# Patient Record
Sex: Female | Born: 1940
Health system: Southern US, Community
[De-identification: ages and names within clinical notes are randomized; demographics above are authoritative.]

## PROBLEM LIST (undated history)

## (undated) DIAGNOSIS — K219 Gastro-esophageal reflux disease without esophagitis: Secondary | ICD-10-CM

## (undated) DIAGNOSIS — K52839 Microscopic colitis, unspecified: Secondary | ICD-10-CM

## (undated) DIAGNOSIS — I10 Essential (primary) hypertension: Secondary | ICD-10-CM

## (undated) DIAGNOSIS — K589 Irritable bowel syndrome without diarrhea: Secondary | ICD-10-CM

## (undated) DIAGNOSIS — T781XXA Other adverse food reactions, not elsewhere classified, initial encounter: Secondary | ICD-10-CM

## (undated) HISTORY — PX: APPENDECTOMY: SHX54

## (undated) HISTORY — DX: Gastro-esophageal reflux disease without esophagitis: K21.9

## (undated) HISTORY — PX: OVARIAN CYST REMOVAL: SHX89

## (undated) HISTORY — DX: Microscopic colitis, unspecified: K52.839

## (undated) HISTORY — DX: Irritable bowel syndrome without diarrhea: K58.9

---

## 1999-04-02 ENCOUNTER — Other Ambulatory Visit: Admission: RE | Admit: 1999-04-02 | Discharge: 1999-04-02 | Payer: Self-pay | Admitting: Family Medicine

## 1999-05-15 ENCOUNTER — Encounter: Admission: RE | Admit: 1999-05-15 | Discharge: 1999-05-15 | Payer: Self-pay | Admitting: Family Medicine

## 1999-05-15 ENCOUNTER — Encounter: Payer: Self-pay | Admitting: Family Medicine

## 2000-05-19 ENCOUNTER — Encounter: Admission: RE | Admit: 2000-05-19 | Discharge: 2000-05-19 | Payer: Self-pay | Admitting: Family Medicine

## 2000-05-19 ENCOUNTER — Encounter: Payer: Self-pay | Admitting: Family Medicine

## 2000-05-21 ENCOUNTER — Other Ambulatory Visit: Admission: RE | Admit: 2000-05-21 | Discharge: 2000-05-21 | Payer: Self-pay | Admitting: Family Medicine

## 2014-03-09 ENCOUNTER — Other Ambulatory Visit: Payer: Self-pay | Admitting: Allergy and Immunology

## 2014-03-09 ENCOUNTER — Ambulatory Visit
Admission: RE | Admit: 2014-03-09 | Discharge: 2014-03-09 | Disposition: A | Discharge status: Home / Self Care | Payer: Medicare Other | Source: Ambulatory Visit | Attending: Allergy and Immunology | Admitting: Allergy and Immunology

## 2014-03-09 DIAGNOSIS — R05 Cough: Secondary | ICD-10-CM

## 2014-03-09 DIAGNOSIS — R059 Cough, unspecified: Secondary | ICD-10-CM

## 2014-03-10 ENCOUNTER — Other Ambulatory Visit: Payer: Self-pay | Admitting: Allergy and Immunology

## 2014-03-10 DIAGNOSIS — R911 Solitary pulmonary nodule: Secondary | ICD-10-CM

## 2014-03-13 ENCOUNTER — Ambulatory Visit
Admission: RE | Admit: 2014-03-13 | Discharge: 2014-03-13 | Disposition: A | Discharge status: Home / Self Care | Payer: Medicare Other | Source: Ambulatory Visit | Attending: Allergy and Immunology | Admitting: Allergy and Immunology

## 2014-03-13 DIAGNOSIS — R911 Solitary pulmonary nodule: Secondary | ICD-10-CM

## 2016-01-03 IMAGING — CR DG CHEST 2V
2 series · 2 of 2 positions shown · non-contrast
Comparison: None.

CLINICAL DATA: Productive cough for 2 weeks ; chest soreness
secondary to and coughing

EXAM:
CHEST  2 VIEW

[view not recorded (1 of 2)]
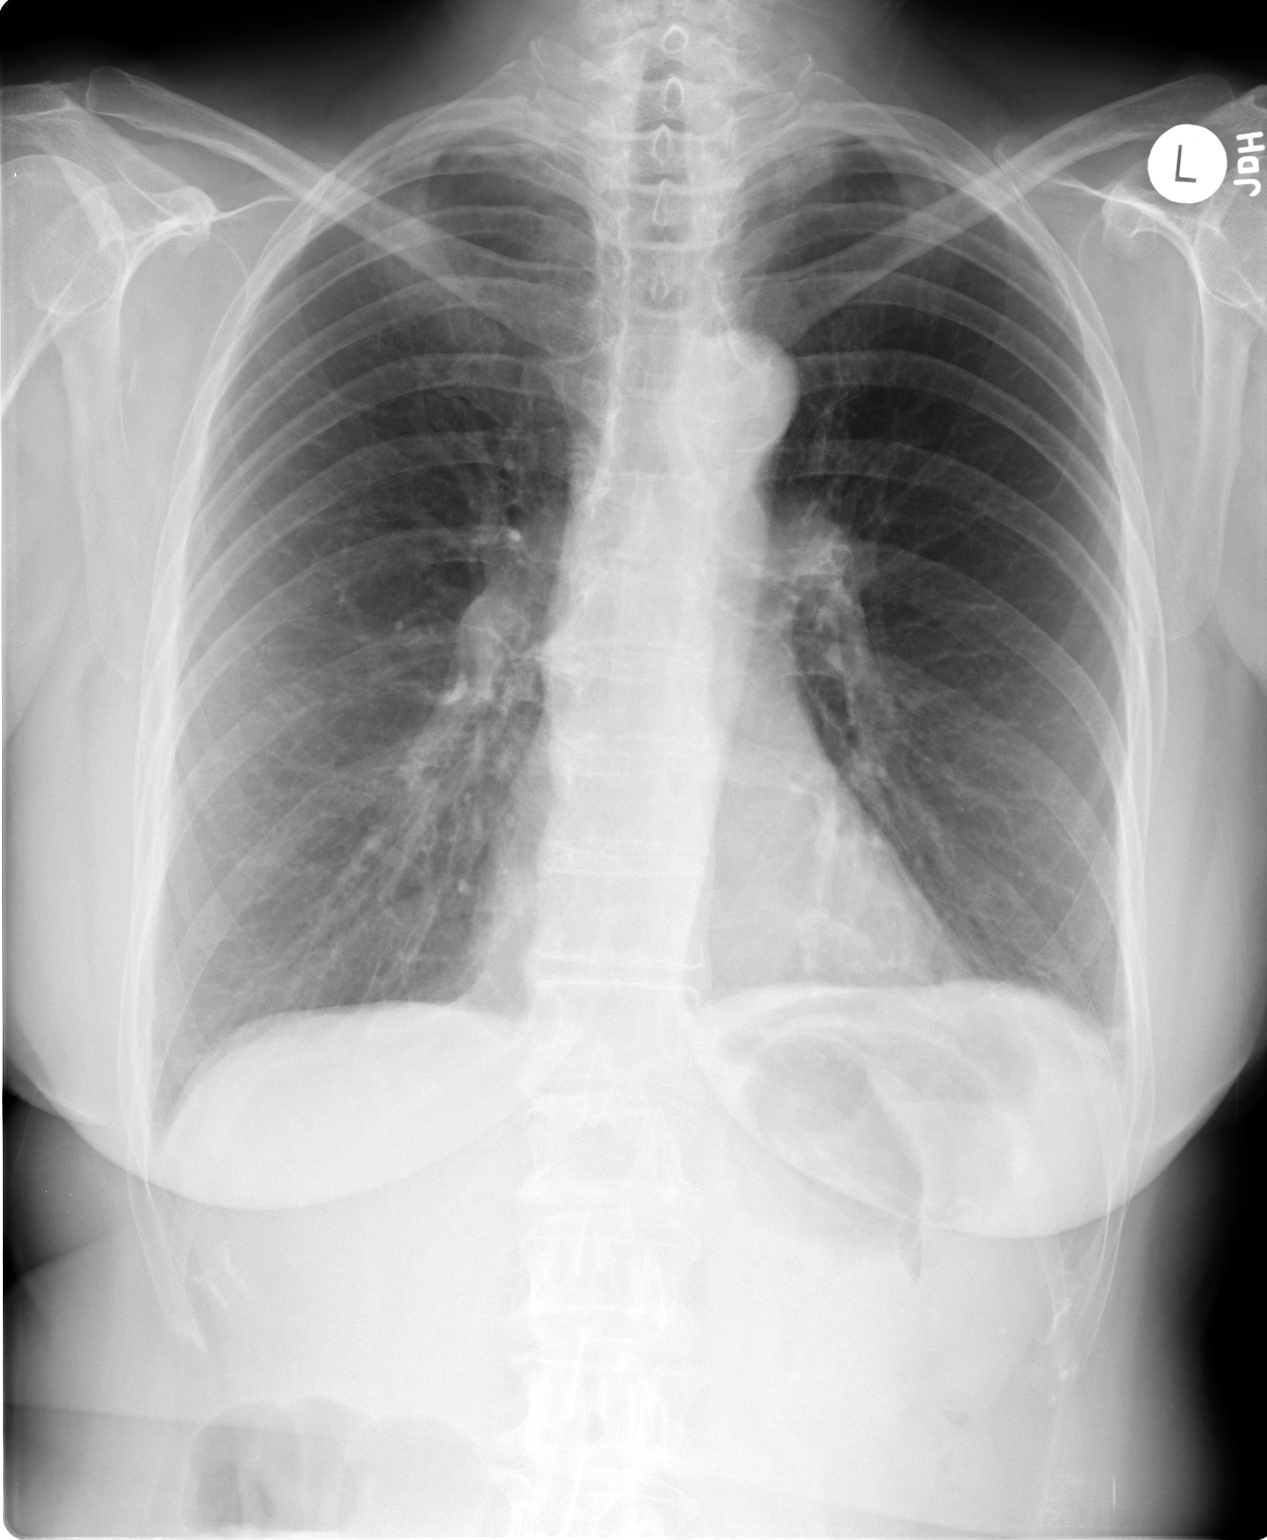

[view not recorded (2 of 2)]
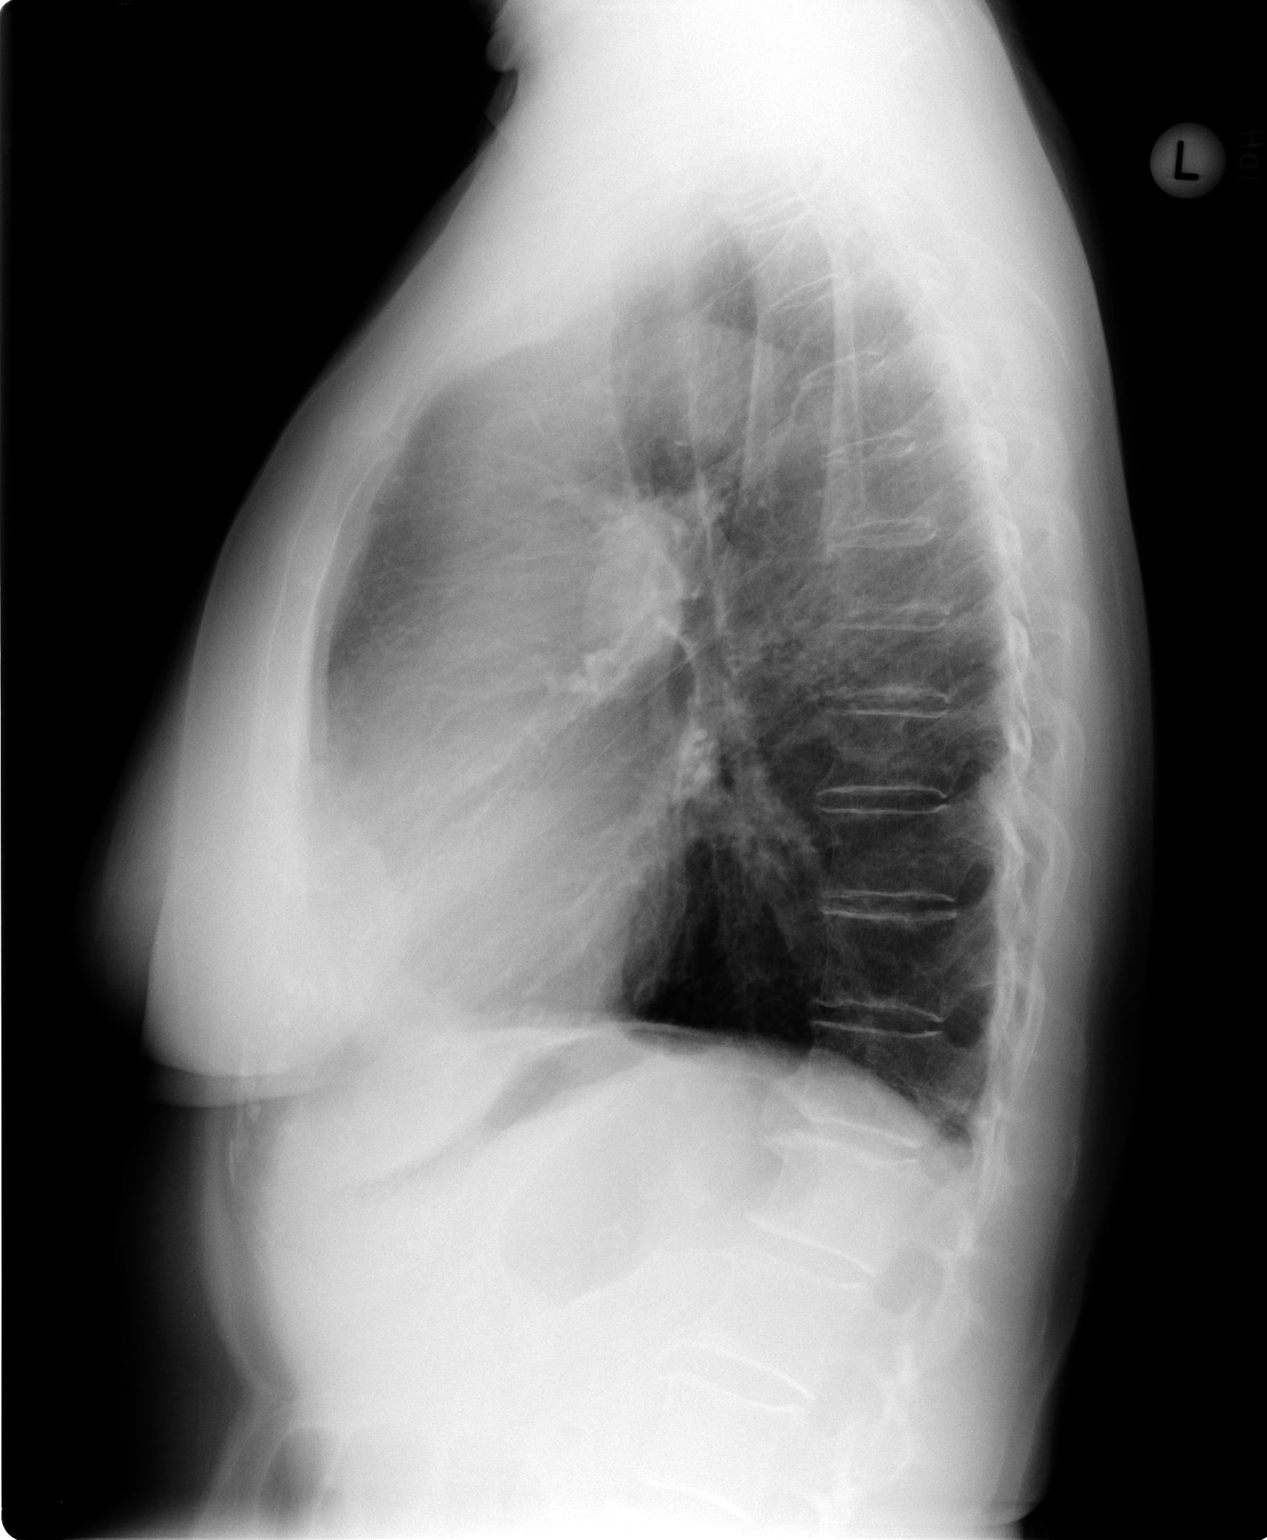

[2 of 2 positions shown; findings below may reference images not displayed]

FINDINGS: The lungs are mildly hyperinflated with hemidiaphragm flattening.
There is no focal infiltrate. However, there is subtle increased
density in the right pulmonary apex. This is end a region where ribs
and the medial aspect of the right clavicle overlap. The heart and
pulmonary vascularity are normal. There is minimal density
projecting over the left lateral costophrenic angle. The bony thorax
exhibits no acute abnormality.
IMPRESSION: Hyperinflation consistent with COPD. There is subtle increased
density in the right upper lobe which is nonspecific but could
reflect pneumonia or an abnormal nodule or mass. At minimum at
apical lordotic film is needed. Alternatively chest CT scanning
would allow better evaluation of the pulmonary parenchyma.

## 2019-06-19 ENCOUNTER — Ambulatory Visit: Payer: Medicare Other | Attending: Internal Medicine

## 2019-06-19 DIAGNOSIS — Z23 Encounter for immunization: Secondary | ICD-10-CM

## 2019-06-19 NOTE — Progress Notes (Signed)
   Covid-19 Vaccination Clinic  Name:  Lauren Rice    MRN: [id number] DOB: Oct 21, 1940  06/19/2019  Lauren Rice was observed post Covid-19 immunization for 15 minutes without incidence. She was provided with Vaccine Information Sheet and instruction to access the V-Safe system.   Lauren Rice was instructed to call 911 with any severe reactions post vaccine: Marland Kitchen Difficulty breathing  . Swelling of your face and throat  . A fast heartbeat  . A bad rash all over your body  . Dizziness and weakness    Immunizations Administered    Name Date Dose VIS Date Route   Pfizer COVID-19 Vaccine 06/19/2019 10:43 AM 0.3 mL 04/01/2019 Intramuscular   Manufacturer: [employer], [employer]   Lot: [id number]   NDC: [address]-3

## 2019-06-27 ENCOUNTER — Encounter (HOSPITAL_COMMUNITY): Payer: Self-pay | Admitting: Emergency Medicine

## 2019-06-27 ENCOUNTER — Other Ambulatory Visit: Payer: Self-pay

## 2019-06-27 ENCOUNTER — Emergency Department (HOSPITAL_COMMUNITY)
Admission: EM | Admit: 2019-06-27 | Discharge: 2019-06-27 | Disposition: A | Discharge status: Home / Self Care | Payer: Medicare Other | Attending: Emergency Medicine | Admitting: Emergency Medicine

## 2019-06-27 ENCOUNTER — Emergency Department (HOSPITAL_COMMUNITY): Payer: Medicare Other

## 2019-06-27 DIAGNOSIS — R42 Dizziness and giddiness: Secondary | ICD-10-CM | POA: Insufficient documentation

## 2019-06-27 DIAGNOSIS — Z20822 Contact with and (suspected) exposure to covid-19: Secondary | ICD-10-CM | POA: Diagnosis not present

## 2019-06-27 DIAGNOSIS — J0110 Acute frontal sinusitis, unspecified: Secondary | ICD-10-CM

## 2019-06-27 DIAGNOSIS — I10 Essential (primary) hypertension: Secondary | ICD-10-CM | POA: Diagnosis not present

## 2019-06-27 HISTORY — DX: Essential (primary) hypertension: I10

## 2019-06-27 LAB — COMPREHENSIVE METABOLIC PANEL
ALT: 15 U/L (ref 0–44)
AST: 22 U/L (ref 15–41)
Albumin: 4.3 g/dL (ref 3.5–5.0)
Alkaline Phosphatase: 53 U/L (ref 38–126)
Anion gap: 11 (ref 5–15)
BUN: 19 mg/dL (ref 8–23)
CO2: 25 mmol/L (ref 22–32)
Calcium: 9.2 mg/dL (ref 8.9–10.3)
Chloride: 101 mmol/L (ref 98–111)
Creatinine, Ser: 0.84 mg/dL (ref 0.44–1.00)
GFR calc Af Amer: >60 mL/min (ref >60)
GFR calc non Af Amer: >60 mL/min (ref >60)
Glucose, Bld: 117 mg/dL — ABNORMAL HIGH (ref 70–99)
Potassium: 3.5 mmol/L (ref 3.5–5.1)
Sodium: 137 mmol/L (ref 135–145)
Total Bilirubin: 1.1 mg/dL (ref 0.3–1.2)
Total Protein: 7.9 g/dL (ref 6.5–8.1)

## 2019-06-27 LAB — CBC
HCT: 38.4 % (ref 36.0–46.0)
Hemoglobin: 12.9 g/dL (ref 12.0–15.0)
MCH: 33.9 pg (ref 26.0–34.0)
MCHC: 33.6 g/dL (ref 30.0–36.0)
MCV: 100.8 fL — ABNORMAL HIGH (ref 80.0–100.0)
Platelets: 173 10*3/uL (ref 150–400)
RBC: 3.81 MIL/uL — ABNORMAL LOW (ref 3.87–5.11)
RDW: 13.3 % (ref 11.5–15.5)
WBC: 19.2 10*3/uL — ABNORMAL HIGH (ref 4.0–10.5)
nRBC: 0 % (ref 0.0–0.2)

## 2019-06-27 LAB — POC SARS CORONAVIRUS 2 AG -  ED: SARS Coronavirus 2 Ag: NEGATIVE

## 2019-06-27 LAB — LIPASE, BLOOD: Lipase: 22 U/L (ref 11–51)

## 2019-06-27 MED ORDER — SODIUM CHLORIDE 0.9 % IV BOLUS
500.0000 mL | Freq: Once | INTRAVENOUS | Status: AC
  Administered 2019-06-27: 500 mL via INTRAVENOUS

## 2019-06-27 MED ORDER — AZITHROMYCIN 250 MG PO TABS: 250.0000 mg | ORAL_TABLET | Freq: Every day | ORAL | 0 refills | Status: AC

## 2019-06-27 MED ORDER — TRIAMCINOLONE ACETONIDE 55 MCG/ACT NA AERO: 2.0000 | INHALATION_SPRAY | Freq: Every day | NASAL | 0 refills | Status: DC

## 2019-06-27 MED ORDER — CETIRIZINE-PSEUDOEPHEDRINE ER 5-120 MG PO TB12: 1.0000 | ORAL_TABLET | Freq: Every day | ORAL | 0 refills | Status: AC

## 2019-06-27 MED ORDER — AZITHROMYCIN 250 MG PO TABS
500.0000 mg | ORAL_TABLET | Freq: Once | ORAL | Status: AC
  Administered 2019-06-27: 500 mg via ORAL
  Filled 2019-06-27: qty 2

## 2019-06-27 NOTE — ED Notes (Signed)
Daughter Sheralyn Boatman would like to be called at [phone] with updates.

## 2019-06-27 NOTE — ED Notes (Signed)
LKW 2200.  Woke up this am with headache and left chin pt says, "felt funny", not numb or tingle.  Rates headache 4/10.  C/o dizziness with movement..  N/v about 5-6 times, diarrhea 5-6 times, started about 06:30 this am.  N/V/D resolved about 1130 am.

## 2019-06-27 NOTE — ED Provider Notes (Signed)
[facility] EMERGENCY DEPARTMENT Provider Note   CSN: [id number] Arrival date & time: 06/27/19  1714     History Chief Complaint  Patient presents with  . Dizziness    [place] is a 79 y.o. female.  HPI    Patient presents with concern of headache, nausea, vomiting, diarrhea.  Patient has history of IBS, does not have a history of headaches, and states that she is generally well.  She was well until waking up this morning with headache, frontal, severe, nonradiating.  Subsequently she was aware of dizziness, nausea.  After that she had multiple episodes of vomiting clear liquid, and even more episodes of diarrhea.  She did have some lower abdominal discomfort following episodes of diarrhea, but has no abdominal pain currently.  She is unaware of fever, denies chest pain, denies dyspnea.  She has no history of stroke, nor similar prior episodes. Today she went to an urgent care in [place].  She had an evaluation there, noted for leukocytosis, according the patient and was sent here for further evaluation.   Past Medical History:  Diagnosis Date  . Hypertension     There are no problems to display for this patient.   Past Surgical History:  Procedure Laterality Date  . APPENDECTOMY    . OVARIAN CYST REMOVAL       OB History   No obstetric history on file.     No family history on file.  Social History   Tobacco Use  . Smoking status: Never Smoker  . Smokeless tobacco: Never Used  Substance Use Topics  . Alcohol use: Never  . Drug use: Never    Home Medications Prior to Admission medications   Not on File    Allergies    Patient has no allergy information on record.  Review of Systems   Review of Systems  Constitutional:       Per HPI, otherwise negative  HENT:       Per HPI, otherwise negative  Respiratory:       Per HPI, otherwise negative  Cardiovascular:       Per HPI, otherwise negative  Gastrointestinal: Positive for diarrhea and  vomiting.  Endocrine:       Negative aside from HPI  Genitourinary:       Neg aside from HPI   Musculoskeletal:       Per HPI, otherwise negative  Skin: Negative.   Neurological: Positive for dizziness and headaches. Negative for syncope and weakness.    Physical Exam Updated Vital Signs BP (!) 99/56 (BP Location: Right Arm)   Pulse 94   Temp 98.9 F (37.2 C) (Oral)   Resp 18   Ht 5' 5.5" (1.664 m)   Wt 61.7 kg   SpO2 99%   BMI 22.29 kg/m   Physical Exam Vitals and nursing note reviewed.  Constitutional:      General: She is not in acute distress.    Appearance: She is well-developed.  HENT:     Head: Normocephalic and atraumatic.  Eyes:     Conjunctiva/sclera: Conjunctivae normal.  Cardiovascular:     Rate and Rhythm: Normal rate and regular rhythm.  Pulmonary:     Effort: Pulmonary effort is normal. No respiratory distress.     Breath sounds: Normal breath sounds. No stridor.  Abdominal:     General: There is no distension.     Tenderness: There is no abdominal tenderness. There is no guarding.  Skin:  General: Skin is warm and dry.  Neurological:     Mental Status: She is alert and oriented to person, place, and time.     Cranial Nerves: No cranial nerve deficit.     Comments: Patient awake, alert, speaking clearly, moves her head freely without approach dizziness.  She moves all extremities spontaneously, follows commands appropriately, has no gross deficiencies.     ED Results / Procedures / Treatments   Labs (all labs ordered are listed, but only abnormal results are displayed) Labs Reviewed  CBC - Abnormal; Notable for the following components:      Result Value   WBC 19.2 (*)    RBC 3.81 (*)    MCV 100.8 (*)    All other components within normal limits  COMPREHENSIVE METABOLIC PANEL - Abnormal; Notable for the following components:   Glucose, Bld 117 (*)    All other components within normal limits  LIPASE, BLOOD  URINALYSIS, ROUTINE W REFLEX  MICROSCOPIC  POC SARS CORONAVIRUS 2 AG -  ED    EKG EKG Interpretation  Date/Time:  Monday June 27 2019 17:30:55 EST Ventricular Rate:  94 PR Interval:  120 QRS Duration: 82 QT Interval:  342 QTC Calculation: 427 R Axis:   -122 Text Interpretation: Normal sinus rhythm Right superior axis deviation Right ventricular hypertrophy ST-t wave abnormality Abnormal ECG Confirmed by Gerhard Munch [phone]) on 06/27/2019 5:38:10 PM   Radiology CT Head Wo Contrast  Result Date: 06/27/2019 CLINICAL DATA:  Headache nausea vomiting EXAM: CT HEAD WITHOUT CONTRAST TECHNIQUE: Contiguous axial images were obtained from the base of the skull through the vertex without intravenous contrast. COMPARISON:  None. FINDINGS: Brain: No acute territorial infarction, hemorrhage or intracranial mass. The ventricles are nonenlarged. Vascular: No hyperdense vessels.  Carotid vascular calcification Skull: Normal. Negative for fracture or focal lesion. Sinuses/Orbits: Mucosal thickening in the sphenoid, ethmoid, maxillary and frontal sinuses with small fluid level in the sphenoid sinus. Other: None IMPRESSION: 1. No CT evidence for acute intracranial abnormality. 2. Sinusitis Electronically Signed   By: Jasmine Pang M.D.   On: 06/27/2019 18:44   DG Chest Port 1 View  Result Date: 06/27/2019 CLINICAL DATA:  Hypotensive EXAM: PORTABLE CHEST 1 VIEW COMPARISON:  03/09/2014 FINDINGS: No acute airspace disease or effusion. Stable cardiomediastinal silhouette. No pneumothorax. IMPRESSION: No active disease. Electronically Signed   By: Jasmine Pang M.D.   On: 06/27/2019 18:41    Procedures Procedures (including critical care time)  Medications Ordered in ED Medications  azithromycin (ZITHROMAX) tablet 500 mg (has no administration in time range)  sodium chloride 0.9 % bolus 500 mL (0 mLs Intravenous Stopped 06/27/19 1827)    ED Course  I have reviewed the triage vital signs and the nursing notes.  Pertinent labs & imaging  results that were available during my care of the patient were reviewed by me and considered in my medical decision making (see chart for details).    MDM Rules/Calculators/A&P                      8:07 PM Patient is awake, alert, speaking clearly.  I reviewed labs from here, and though she has not provided urinalysis yet, on reviewing papers from earlier today, there is no evidence for urinary tract infection and she has no urinary complaints.  Today's labs here notable for mild leukocytosis, slightly increased from earlier in the day.  She has, however, afebrile, awake, alert, with no hypotension suggesting bacteremia, sepsis.  CT  is consistent with diffuse sinus inflammation, and given patient scription of pain primarily in her anterior head, her suspicion for acute sinusitis contributing to today's episode.  There is no focal neurologic deficiencies, though she does complain of dizziness, there low, but nonzero suspicion for TIA/stroke, thus we discussed MRI, indication for such.    Patient amenable to initiation of antibiotics, with close outpatient follow-up including consideration of additional studies possibly MRI if she does not improve in the coming days.  Also discussed option of staying here for further monitoring, antibiotics, overnight, but the patient has a strong preference for discharge, and given her improvement, absence of hemodynamic instability, this is reasonable. Final Clinical Impression(s) / ED Diagnoses Final diagnoses:  Dizziness  Acute non-recurrent frontal sinusitis    Rx / DC Orders ED Discharge Orders         Ordered    azithromycin (ZITHROMAX) 250 MG tablet  Daily     06/27/19 2013    triamcinolone (NASACORT) 55 MCG/ACT AERO nasal inhaler  Daily     06/27/19 2013    cetirizine-pseudoephedrine (ZYRTEC-D) 5-120 MG tablet  Daily     06/27/19 2013           Gerhard Munch, MD 06/27/19 2015

## 2019-06-27 NOTE — ED Triage Notes (Signed)
Pt states waking up dizzy, n/v/d with no relief.

## 2019-06-27 NOTE — Discharge Instructions (Addendum)
As discussed, today's findings have been generally reassuring, though there is demonstration of acute sinusitis on your CAT scan.  With this consideration is important to take your prescribed medication as instructed: Antibiotics for 4 more days, oral tablet for 7 more days, and inhaler for the next 14 days.  Additionally, please monitor your condition carefully, and do not hesitate to either return here or see your physician should you either not improve or have any notable changes in your condition.

## 2019-07-13 ENCOUNTER — Ambulatory Visit: Payer: Medicare Other | Attending: Internal Medicine

## 2019-07-13 DIAGNOSIS — Z23 Encounter for immunization: Secondary | ICD-10-CM

## 2019-07-13 NOTE — Progress Notes (Signed)
   Covid-19 Vaccination Clinic  Name:  Lauren Rice    MRN: [id number] DOB: [birth year]  07/13/2019  [name] was observed post Covid-19 immunization for 15 minutes without incident. She was provided with Vaccine Information Sheet and instruction to access the V-Safe system.   [name] was instructed to call 911 with any severe reactions post vaccine: Marland Kitchen Difficulty breathing  . Swelling of face and throat  . A fast heartbeat  . A bad rash all over body  . Dizziness and weakness   Immunizations Administered    Name Date Dose VIS Date Route   Pfizer COVID-19 Vaccine 07/13/2019  2:40 PM 0.3 mL 04/01/2019 Intramuscular   Manufacturer: [employer], [employer]   Lot: [id number]   NDC: [address]-8

## 2020-06-19 HISTORY — PX: HAND SURGERY: SHX662

## 2020-06-22 ENCOUNTER — Other Ambulatory Visit (HOSPITAL_COMMUNITY): Payer: Self-pay | Admitting: Orthopaedic Surgery

## 2020-06-22 MED FILL — HYDROCODON-APAP 5-325: 5-325 | 2 days supply | Qty: 15 | Fill #0

## 2020-10-11 ENCOUNTER — Encounter (INDEPENDENT_AMBULATORY_CARE_PROVIDER_SITE_OTHER): Payer: Self-pay | Admitting: Gastroenterology

## 2021-02-11 ENCOUNTER — Ambulatory Visit (INDEPENDENT_AMBULATORY_CARE_PROVIDER_SITE_OTHER): Payer: Medicare Other | Admitting: Gastroenterology

## 2021-02-26 ENCOUNTER — Ambulatory Visit (INDEPENDENT_AMBULATORY_CARE_PROVIDER_SITE_OTHER): Payer: Medicare Other | Admitting: Gastroenterology

## 2021-02-26 ENCOUNTER — Encounter (INDEPENDENT_AMBULATORY_CARE_PROVIDER_SITE_OTHER): Payer: Self-pay | Admitting: Gastroenterology

## 2021-02-26 ENCOUNTER — Other Ambulatory Visit: Payer: Self-pay

## 2021-02-26 VITALS — BP 143/79 | HR 71 | Temp 98.2°F | Ht 65.5 in | Wt 144.5 lb

## 2021-02-26 DIAGNOSIS — K580 Irritable bowel syndrome with diarrhea: Secondary | ICD-10-CM | POA: Insufficient documentation

## 2021-02-26 DIAGNOSIS — K219 Gastro-esophageal reflux disease without esophagitis: Secondary | ICD-10-CM | POA: Insufficient documentation

## 2021-02-26 NOTE — Patient Instructions (Signed)
You can try benefiber with probiotic for added fiber or you can simply try the low FODMAP diet that I have provided along with your current probiotic for you to see if this has any effect on your diarrhea.  FDgard is the over the counter peppermint oil that we discussed, this can also be helpful in IBS with diarrhea.  Imodium is fine to use as needed, please do not exceed 4 tablets in a 24 hour time period  Please let me know if you develop worsening diarrhea, abdominal pain, weight loss, changes in your appetite, blood in your stools or black stools as we may need to proceed with further investigation at that time.   Follow up as needed.

## 2021-02-26 NOTE — Progress Notes (Signed)
Referring Provider: Aniceto Boss, MD Primary Care Physician:  Aniceto Boss, MD Primary GI Physician: newly established, previously Dr. Lynnea Ferrier  Chief Complaint  Patient presents with   Diarrhea    Patient here today with complaints of Diarrhea. She states she has been told she has IBS w Diarrhea. She reports to be doing well as of now, and the IBS D seems to come in cycles. History of alpha gal. On omeprazole 20 mg daily for reflux which has helped, wanted to know if she could decrease dose to 10 mg per day.   HPI:   Lauren Rice is a 79 y.o. female with past medical history of HTN, IBS-D, GERD, alpha gal.  Patient presenting today as new patient to establish GI care for diarrhea and GERD.  GERD:  currently on omeprazole 20mg  daily with good results, denies early satiety, breakthrough reflux, acid regurgitation, dysphagia or odynophagia.   Diarrhea: Hx of IBS and microscopic colitis, she reports that she previously saw Dr. in [facility], however, her insurance would not cover for her to see him anymore.   Previously she used to have diarrhea immediately upon waking up in the mornings, she was diagnosed with microscopic colitis in 2018 and treated with 6 week course of enterocort which seemed to provide results and get her back to her baseline.  she reports that over the past 8-10 months diarrhea has been better, she is taking an otc probiotic which seems to have helped quite a bit. She reports that for many years she has had flares of diarrhea that seem to come in cycles every 2-3 months now which is less frequent than in the past. She reports that she has bristol stool scale 6-7 all the time, On a good day she will have maybe 2 episodes of soft stools the entire day. She has fecal urgency,especially after meals, but denies any fecal incontinence.  With the flares, she may go 4-5 times in the morning and a few more times in the afternoon, she takes imodium as needed  with good result. she has done food diary in the past without significant findings of triggers.  Denies abdominal pain, weight loss, changes in appetite, melena, hematochezia, nausea or vomiting.    Has not had pork or beef in over 3 years due to history of alpha gal. She had previous testing for celiac and had IBD panel that were both negative. She eats a diet high in lean meats such as 2019 and chicken. Reports she probably does not get as much fiber in her diet as she should, she has some occasional bloating, but this is infrequent.   NSAID use: occasional Social hx:no etoh, tobacco or illicit drugs Fam [place] has IBD  Last Colonoscopy:Dr. HE:NIDPOE, 2018  microscopic colitis s/p enterocort x6 weeks.  Last Endoscopy:06/27/16 schatzki ring, mild gastritis, benign gastric polyp, small hh  Past Medical History:  Diagnosis Date   Hypertension     Past Surgical History:  Procedure Laterality Date   APPENDECTOMY     OVARIAN CYST REMOVAL      Current Outpatient Medications  Medication Sig Dispense Refill   acetaminophen (TYLENOL) 325 MG tablet Take 650 mg by mouth every 6 (six) hours as needed.     fluticasone (FLOVENT HFA) 44 MCG/ACT inhaler Inhale 2 puffs into the lungs in the morning and at bedtime.     lisinopril (ZESTRIL) 20 MG tablet Take by mouth.     loratadine (CLARITIN) 10 MG  tablet Claritin 10 mg tablet  Take 1 tablet every day by oral route.     Multiple Vitamins-Minerals (CENTRUM ULTRA WOMENS PO) Take by mouth daily.     omeprazole (PRILOSEC) 20 MG capsule Take 20 mg by mouth daily.     OVER THE COUNTER MEDICATION daily. Probiotic ([place] otc) Multi enzyme once per day.     triamcinolone (NASACORT) 55 MCG/ACT AERO nasal inhaler Place 2 sprays into the nose daily for 14 days. Use daily for five days 1 Inhaler 0   No current facility-administered medications for this visit.    Allergies as of 02/26/2021   (No Known Allergies)    Family History  Problem  Relation Age of Onset   Heart disease Mother    Emphysema Father    Crohn's disease Sister     Social History   Socioeconomic History   Marital status: Married    Spouse name: Not on file   Number of children: Not on file   Years of education: Not on file   Highest education level: Not on file  Occupational History   Not on file  Tobacco Use   Smoking status: Never   Smokeless tobacco: Never  Vaping Use   Vaping Use: Never used  Substance and Sexual Activity   Alcohol use: Never   Drug use: Never   Sexual activity: Not on file  Other Topics Concern   Not on file  Social History Narrative   Not on file   Social Determinants of Health   Financial Resource Strain: Not on file  Food Insecurity: Not on file  Transportation Needs: Not on file  Physical Activity: Not on file  Stress: Not on file  Social Connections: Not on file   Review of systems General: negative for malaise, night sweats, fever, chills, weight loss Neck: Negative for lumps, goiter, pain and significant neck swelling Resp: Negative for cough, wheezing, dyspnea at rest CV: Negative for chest pain, leg swelling, palpitations, orthopnea GI: denies melena, hematochezia, nausea, vomiting, constipation, dysphagia, odyonophagia, early satiety or unintentional weight loss. +diarrhea MSK: Negative for joint pain or swelling, back pain, and muscle pain. Derm: Negative for itching or rash Psych: Denies depression, anxiety, memory loss, confusion. No homicidal or suicidal ideation.  Heme: Negative for prolonged bleeding, bruising easily, and swollen nodes. Endocrine: Negative for cold or heat intolerance, polyuria, polydipsia and goiter. Neuro: negative for tremor, gait imbalance, syncope and seizures. The remainder of the review of systems is noncontributory.  Physical Exam: BP (!) 143/79 (BP Location: Left Arm, Patient Position: Sitting, Cuff Size: Large)   Pulse 71   Temp 98.2 F (36.8 C) (Oral)   Ht 5'  5.5" (1.664 m)   Wt 144 lb 8 oz (65.5 kg)   BMI 23.68 kg/m  General:   Alert and oriented. No distress noted. Pleasant and cooperative.  Head:  Normocephalic and atraumatic. Eyes:  Conjuctiva clear without scleral icterus. Mouth:  Oral mucosa pink and moist. Good dentition. No lesions. Heart: Normal rate and rhythm, s1 and s2 heart sounds present.  Lungs: Clear lung sounds in all lobes. Respirations equal and unlabored. Abdomen:  +BS, soft, non-tender and non-distended. No rebound or guarding. No HSM or masses noted. Derm: No palmar erythema or jaundice Msk:  Symmetrical without gross deformities. Normal posture. Extremities:  Without edema. Neurologic:  Alert and  oriented x4 Psych:  Alert and cooperative. Normal mood and affect.  Invalid input(s): 6 MONTHS   ASSESSMENT: Lauren Rice is  a 80 y.o. female presenting today to establish GI care as her previous GI provider was no longer covered under her insurance. Hx of GERD, IBS-D and microscopic colitis s/p 6 week course of enterocort.   She was followed by Dr. Tommie Sams for IBS-D, microscopic colitis in 2018, s/p enterocort 6 week course. States that she has had looser stools/diarrhea for many years at baseline and feels that over the past 8-10 months, since starting an otc probiotic, her IBS has been better managed with less flare ups of worsening diarrhea. Typically has 2 BMs that are looser, per day, every 2-3 months, she will have a flare where she experiences more frequent diarrhea maybe 4-5 episodes in the morning and a few later in the day, this will last a few days, she takes imodium as needed, typically not multiple days in a row, gets good results from this. She denies vomiting, nausea, abdominal pain, weight loss, decrease in appetite, melena or blood in stools. At this time she feels that her symptoms are well managed. She is not interested in any further evaluation of her symptoms at this time, which I think is reasonable  given her improvement over the past few months and lack of red flag symptoms, however, she will let me know if she develops any new or worsening GI symptoms. For now we will try low FODMAP diet, continue probiotic and can try otc FDgard if she feels imodium is not working for her, otherwise can use imodium as needed. She is not interested in dicyclomine or levsin at this time.    PLAN:  Trial low FODMAP diet 2. Continue probiotic with good result or can do benefiber+probiotic 3. Can try otc FDgard  4. Imodium as needed, do not exceed 4 tablets in 24 hours 5. Patient to make me aware of worsening diarrhea, abdominal pain, weight loss, appetite changes, blood in stools, black stools   Follow Up: As needed  Addison Freimuth L. Alver Sorrow, MSN, APRN, AGNP-C Adult-Gerontology Nurse Practitioner [facility]

## 2021-04-22 IMAGING — CT CT HEAD W/O CM
4 series · 16 of 47 positions shown, 18 images · non-contrast
Comparison: None.

CLINICAL DATA: Headache nausea vomiting

EXAM:
CT HEAD WITHOUT CONTRAST
TECHNIQUE: Contiguous axial images were obtained from the base of the skull
through the vertex without intravenous contrast.

[Series 2: head w o · axial · 0.42mm/px · z∈[+1335,+1445]mm · 7 of 30 slices shown, 9 images]
[im 4/30  brain]
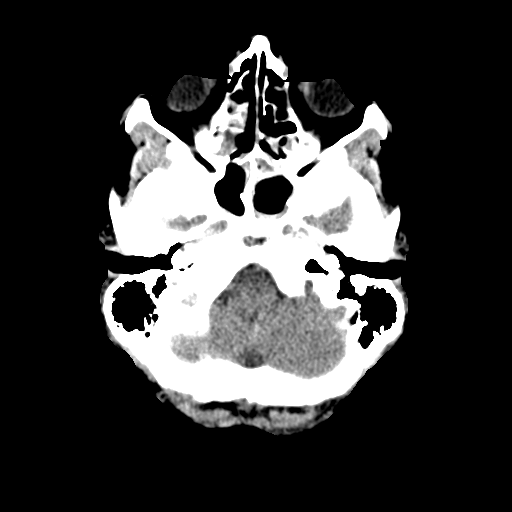
[im 4/30  bone]
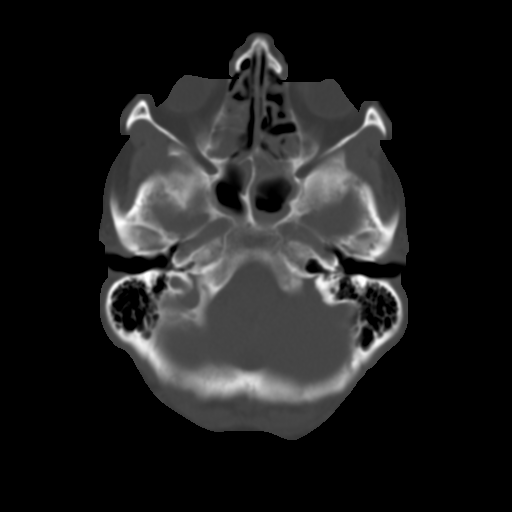
[im 8/30  brain]
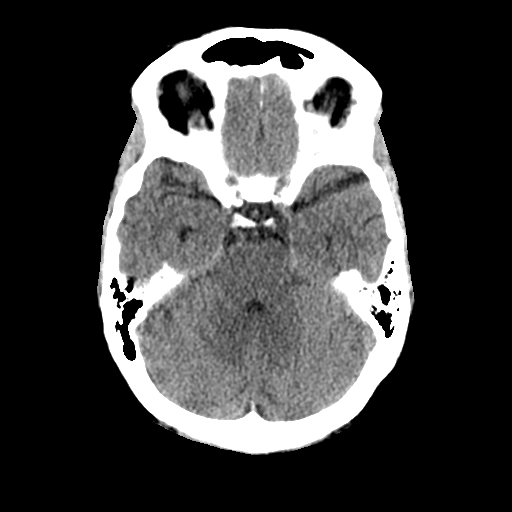
[im 11/30  brain]
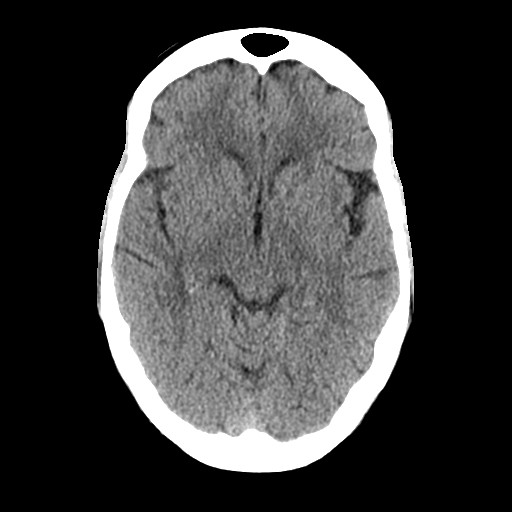
[im 15/30  brain]
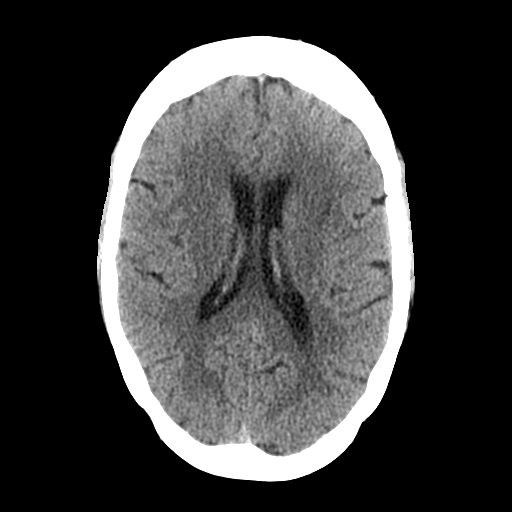
[im 19/30  brain]
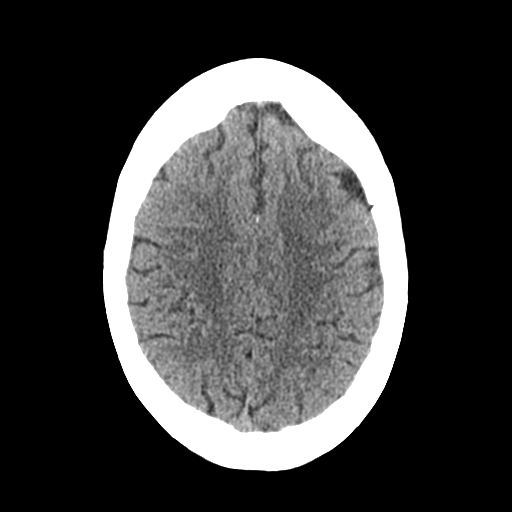
[im 19/30  bone]
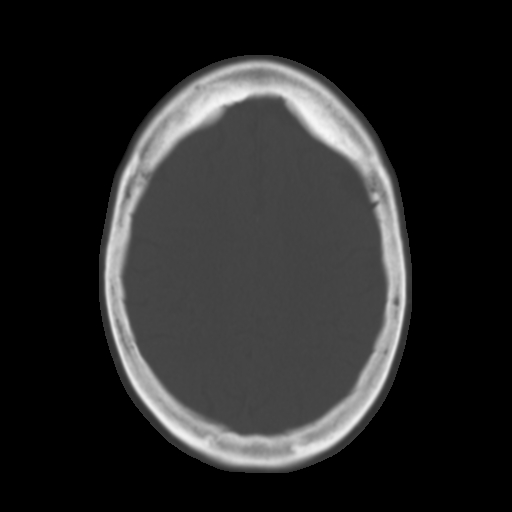
[im 22/30  brain]
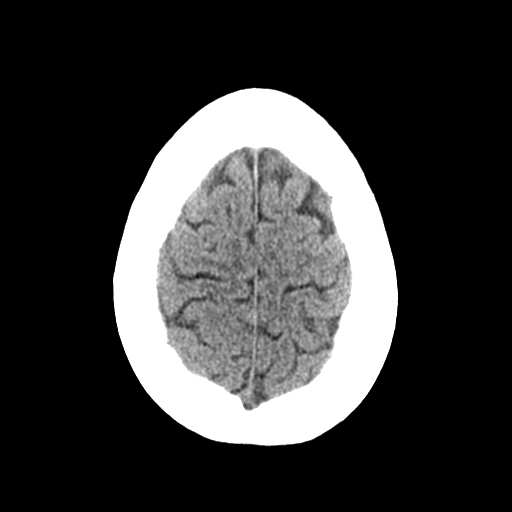
[im 26/30  brain]
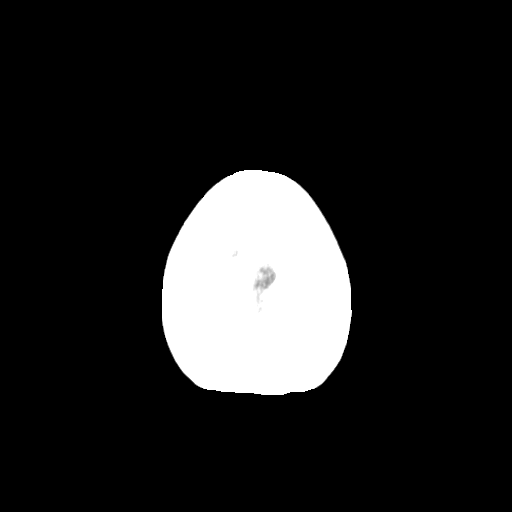

[Series 3: head bone · axial · 0.42mm/px · z∈[+1334,+1362]mm · 3 of 74 slices shown]
[im 8/74  bone]
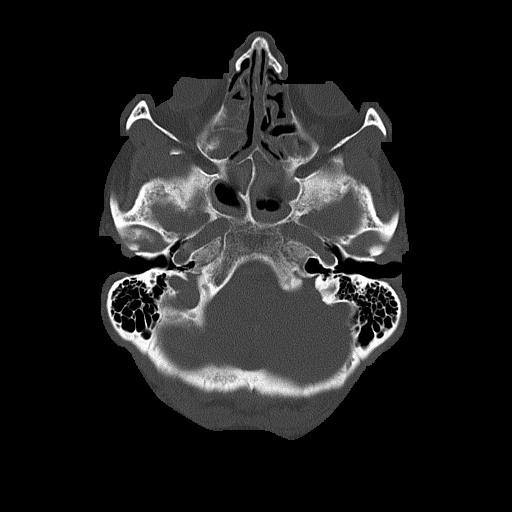
[im 15/74  bone]
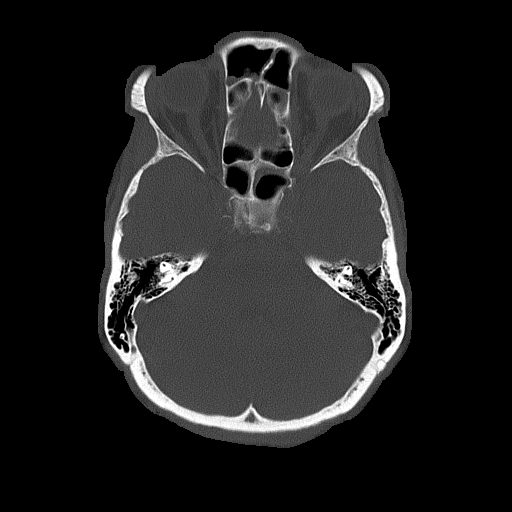
[im 22/74  bone]
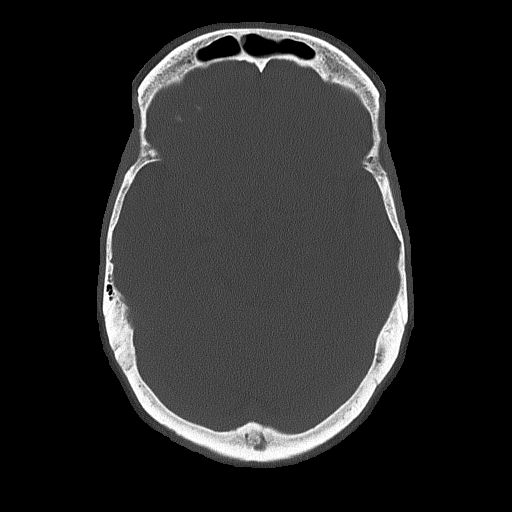

[Series 4: coronal soft · coronal · 0.30mm/px · 3 of 66 slices shown]
[im 22/66  brain]
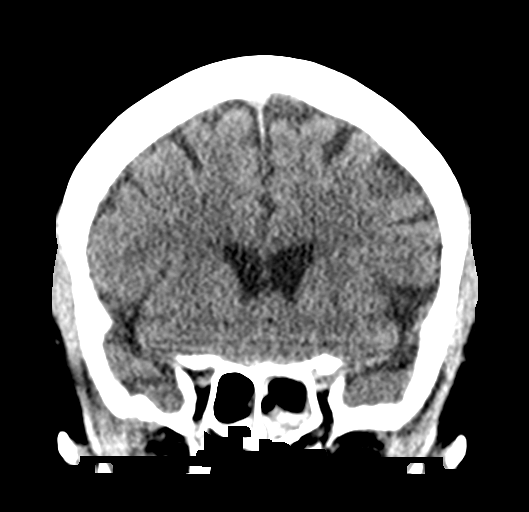
[im 29/66  brain]
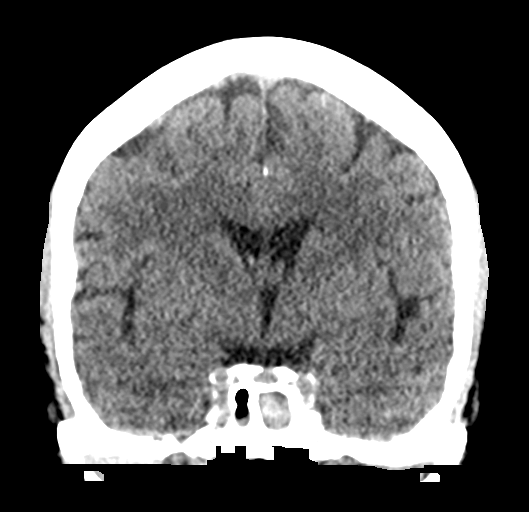
[im 37/66  brain]
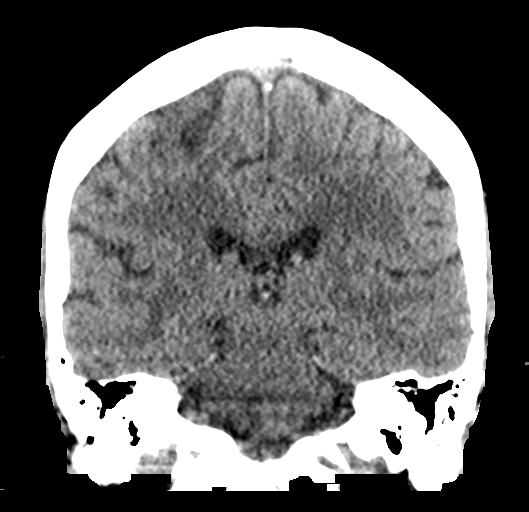

[Series 5: sagittal soft · sagittal · 0.29mm/px · 3 of 53 slices shown]
[im 18/53  brain]
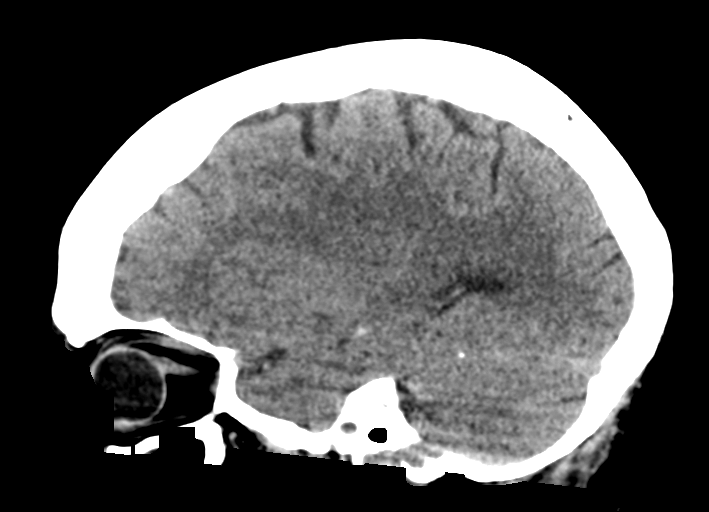
[im 27/53  brain]
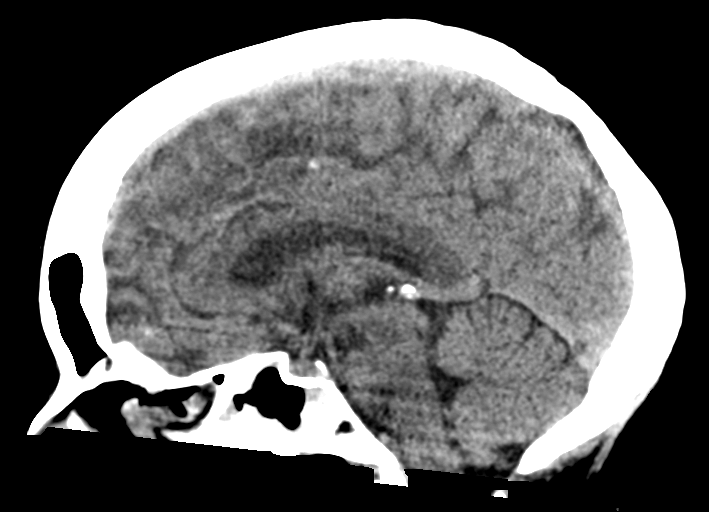
[im 35/53  brain]
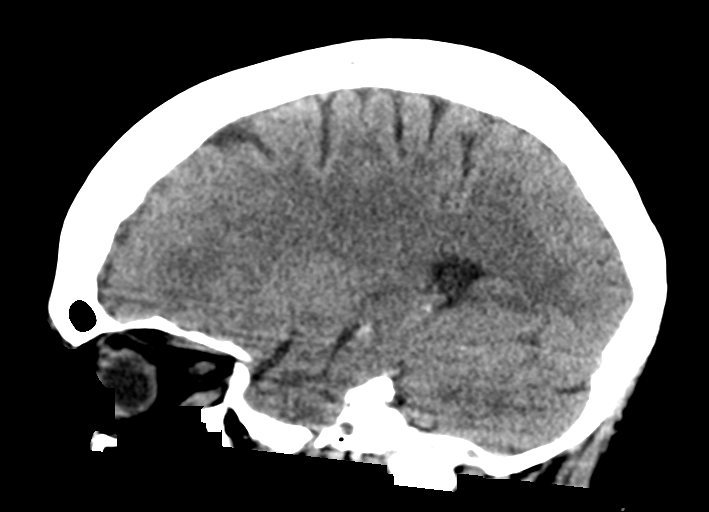

[16 of 47 positions shown; findings below may reference images not displayed]

FINDINGS: Brain: No acute territorial infarction, hemorrhage or intracranial
mass. The ventricles are nonenlarged.

Vascular: No hyperdense vessels.  Carotid vascular calcification

Skull: Normal. Negative for fracture or focal lesion.

Sinuses/Orbits: Mucosal thickening in the sphenoid, ethmoid,
maxillary and frontal sinuses with small fluid level in the sphenoid
sinus.

Other: None
IMPRESSION: 1. No CT evidence for acute intracranial abnormality.
2. Sinusitis

## 2021-11-30 ENCOUNTER — Observation Stay (HOSPITAL_COMMUNITY)
Admission: EM | Admit: 2021-11-30 | Discharge: 2021-12-01 | Disposition: A | Discharge status: Home / Self Care | Payer: Medicare Other | Attending: Internal Medicine | Admitting: Internal Medicine

## 2021-11-30 ENCOUNTER — Other Ambulatory Visit: Payer: Self-pay

## 2021-11-30 ENCOUNTER — Encounter (HOSPITAL_COMMUNITY): Payer: Self-pay | Admitting: *Deleted

## 2021-11-30 ENCOUNTER — Emergency Department (HOSPITAL_COMMUNITY): Payer: Medicare Other

## 2021-11-30 DIAGNOSIS — Z79899 Other long term (current) drug therapy: Secondary | ICD-10-CM | POA: Insufficient documentation

## 2021-11-30 DIAGNOSIS — Z20822 Contact with and (suspected) exposure to covid-19: Secondary | ICD-10-CM | POA: Diagnosis not present

## 2021-11-30 DIAGNOSIS — I10 Essential (primary) hypertension: Secondary | ICD-10-CM | POA: Diagnosis not present

## 2021-11-30 DIAGNOSIS — R531 Weakness: Secondary | ICD-10-CM

## 2021-11-30 DIAGNOSIS — K219 Gastro-esophageal reflux disease without esophagitis: Secondary | ICD-10-CM | POA: Diagnosis not present

## 2021-11-30 DIAGNOSIS — R200 Anesthesia of skin: Secondary | ICD-10-CM | POA: Diagnosis present

## 2021-11-30 DIAGNOSIS — G459 Transient cerebral ischemic attack, unspecified: Secondary | ICD-10-CM | POA: Diagnosis not present

## 2021-11-30 HISTORY — DX: Other adverse food reactions, not elsewhere classified, initial encounter: T78.1XXA

## 2021-11-30 LAB — COMPREHENSIVE METABOLIC PANEL
ALT: 16 U/L (ref 0–44)
AST: 26 U/L (ref 15–41)
Albumin: 4.2 g/dL (ref 3.5–5.0)
Alkaline Phosphatase: 64 U/L (ref 38–126)
Anion gap: 7 (ref 5–15)
BUN: 21 mg/dL (ref 8–23)
CO2: 25 mmol/L (ref 22–32)
Calcium: 9 mg/dL (ref 8.9–10.3)
Chloride: 105 mmol/L (ref 98–111)
Creatinine, Ser: 0.79 mg/dL (ref 0.44–1.00)
GFR, Estimated: >60 mL/min (ref >60)
Glucose, Bld: 106 mg/dL — ABNORMAL HIGH (ref 70–99)
Potassium: 3.8 mmol/L (ref 3.5–5.1)
Sodium: 137 mmol/L (ref 135–145)
Total Bilirubin: 0.7 mg/dL (ref 0.3–1.2)
Total Protein: 7.8 g/dL (ref 6.5–8.1)

## 2021-11-30 LAB — TROPONIN I (HIGH SENSITIVITY)
Troponin I (High Sensitivity): 4 ng/L (ref <18)
Troponin I (High Sensitivity): 6 ng/L (ref <18)

## 2021-11-30 LAB — URINALYSIS, ROUTINE W REFLEX MICROSCOPIC
Bilirubin Urine: NEGATIVE
Glucose, UA: NEGATIVE mg/dL
Hgb urine dipstick: NEGATIVE
Ketones, ur: NEGATIVE mg/dL
Leukocytes,Ua: NEGATIVE
Nitrite: NEGATIVE
Protein, ur: NEGATIVE mg/dL
Specific Gravity, Urine: 1.019 (ref 1.005–1.030)
pH: 7 (ref 5.0–8.0)

## 2021-11-30 LAB — CBC
HCT: 38.9 % (ref 36.0–46.0)
Hemoglobin: 13 g/dL (ref 12.0–15.0)
MCH: 32.9 pg (ref 26.0–34.0)
MCHC: 33.4 g/dL (ref 30.0–36.0)
MCV: 98.5 fL (ref 80.0–100.0)
Platelets: 161 10*3/uL (ref 150–400)
RBC: 3.95 MIL/uL (ref 3.87–5.11)
RDW: 13.2 % (ref 11.5–15.5)
WBC: 5.1 10*3/uL (ref 4.0–10.5)
nRBC: 0 % (ref 0.0–0.2)

## 2021-11-30 LAB — DIFFERENTIAL
Abs Immature Granulocytes: 0.02 10*3/uL (ref 0.00–0.07)
Basophils Absolute: 0.1 10*3/uL (ref 0.0–0.1)
Basophils Relative: 1 %
Eosinophils Absolute: 0.1 10*3/uL (ref 0.0–0.5)
Eosinophils Relative: 2 %
Immature Granulocytes: 0 %
Lymphocytes Relative: 27 %
Lymphs Abs: 1.4 10*3/uL (ref 0.7–4.0)
Monocytes Absolute: 0.5 10*3/uL (ref 0.1–1.0)
Monocytes Relative: 9 %
Neutro Abs: 3.1 10*3/uL (ref 1.7–7.7)
Neutrophils Relative %: 61 %

## 2021-11-30 LAB — RESP PANEL BY RT-PCR (FLU A&B, COVID) ARPGX2
Influenza A by PCR: NEGATIVE
Influenza B by PCR: NEGATIVE
SARS Coronavirus 2 by RT PCR: NEGATIVE

## 2021-11-30 LAB — RAPID URINE DRUG SCREEN, HOSP PERFORMED
Amphetamines: NOT DETECTED
Barbiturates: NOT DETECTED
Benzodiazepines: NOT DETECTED
Cocaine: NOT DETECTED
Opiates: NOT DETECTED
Tetrahydrocannabinol: NOT DETECTED

## 2021-11-30 LAB — PROTIME-INR
INR: 1 (ref 0.8–1.2)
Prothrombin Time: 13.3 seconds (ref 11.4–15.2)

## 2021-11-30 LAB — ETHANOL: Alcohol, Ethyl (B): <10 mg/dL (ref <10)

## 2021-11-30 LAB — APTT: aPTT: 26 seconds (ref 24–36)

## 2021-11-30 MED ORDER — CLOPIDOGREL BISULFATE 75 MG PO TABS
75.0000 mg | ORAL_TABLET | Freq: Every day | ORAL | Status: DC
  Administered 2021-11-30 – 2021-12-01 (×2): 75 mg via ORAL
  Filled 2021-11-30 (×2): qty 1

## 2021-11-30 MED ORDER — IOHEXOL 350 MG/ML SOLN
75.0000 mL | Freq: Once | INTRAVENOUS | Status: AC | PRN
  Administered 2021-11-30: 75 mL via INTRAVENOUS

## 2021-11-30 MED ORDER — ACETAMINOPHEN 160 MG/5ML PO SOLN: 650.0000 mg | ORAL | Status: DC | PRN

## 2021-11-30 MED ORDER — STROKE: EARLY STAGES OF RECOVERY BOOK
Freq: Once | Status: AC
  Filled 2021-11-30 (×2): qty 1

## 2021-11-30 MED ORDER — ACETAMINOPHEN 325 MG PO TABS
650.0000 mg | ORAL_TABLET | ORAL | Status: DC | PRN
  Administered 2021-11-30: 650 mg via ORAL
  Filled 2021-11-30: qty 2

## 2021-11-30 MED ORDER — ACETAMINOPHEN 650 MG RE SUPP: 650.0000 mg | RECTAL | Status: DC | PRN

## 2021-11-30 MED ORDER — ATORVASTATIN CALCIUM 80 MG PO TABS
80.0000 mg | ORAL_TABLET | Freq: Every day | ORAL | Status: DC
  Administered 2021-11-30: 80 mg via ORAL
  Filled 2021-11-30: qty 1

## 2021-11-30 MED ORDER — ASPIRIN 300 MG RE SUPP
300.0000 mg | Freq: Every day | RECTAL | Status: DC
  Filled 2021-11-30: qty 1

## 2021-11-30 MED ORDER — HYDRALAZINE HCL 20 MG/ML IJ SOLN: 5.0000 mg | Freq: Four times a day (QID) | INTRAMUSCULAR | Status: DC | PRN

## 2021-11-30 MED ORDER — SENNOSIDES-DOCUSATE SODIUM 8.6-50 MG PO TABS: 1.0000 | ORAL_TABLET | Freq: Every evening | ORAL | Status: DC | PRN

## 2021-11-30 MED ORDER — ASPIRIN 325 MG PO TABS
325.0000 mg | ORAL_TABLET | Freq: Every day | ORAL | Status: DC
  Administered 2021-11-30: 325 mg via ORAL
  Filled 2021-11-30 (×2): qty 1

## 2021-11-30 NOTE — ED Provider Notes (Signed)
Emergency Department Provider Note   I have reviewed the triage vital signs and the nursing notes.   HISTORY  Chief Complaint Numbness (Face & left arm/)   HPI Lauren Rice is a 81 y.o. female with past history of hypertension and GERD presents to the emergency department with feeling of "dizziness" and "tingling and numbness" to the left face and arm upon waking this AM.  Denies vertigo.  She feels somewhat lightheaded and had significant difficulty with walking.  She feels that the numbness and walking have improved but is still walking slower than normal.  She denies any sudden/severe headache or chest discomfort.  No lingering numbness or appreciable weakness.  When symptoms were most severe she did not appreciate any symptoms in her lower extremities.  No changes to her medications.  She did take her blood pressure and found it to be high this morning.  She says she has not been feeling quite right for most of the week, citing mainly fatigue.  No fevers or chills.  No cough or congestion symptoms. No vomiting or diarrhea.    Past Medical History:  Diagnosis Date   GERD (gastroesophageal reflux disease)    Hypertension    IBS (irritable bowel syndrome)    Microscopic colitis     Review of Systems  Constitutional: No fever/chills Eyes: No visual changes. ENT: No sore throat. Cardiovascular: Denies chest pain. Positive elevated BP this AM.  Respiratory: Denies shortness of breath. Gastrointestinal: No abdominal pain.  No nausea, no vomiting.  No diarrhea.  Musculoskeletal: Negative for back pain. Skin: Negative for rash. Neurological: Negative for headaches. Positive left face and arm numbness. Positive gait instability.    ____________________________________________   PHYSICAL EXAM:  VITAL SIGNS: ED Triage Vitals  Enc Vitals Group     BP 11/30/21 0938 (!) 157/71     Pulse Rate 11/30/21 0938 71     Resp 11/30/21 0938 12     Temp 11/30/21 0938 97.8 F  (36.6 C)     Temp Source 11/30/21 0938 Oral     SpO2 11/30/21 0938 99 %     Weight 11/30/21 0936 143 lb (64.9 kg)     Height 11/30/21 0936 5\' 5"  (1.651 m)   Constitutional: Alert and oriented. Well appearing and in no acute distress. Eyes: Conjunctivae are normal. PERRL. EOMI. No appreciable nystagmus.  Head: Atraumatic. Nose: No congestion/rhinnorhea. Mouth/Throat: Mucous membranes are moist.  Neck: No stridor.  Cardiovascular: Normal rate, regular rhythm. Good peripheral circulation. Grossly normal heart sounds.   Respiratory: Normal respiratory effort.  No retractions. Lungs CTAB. Gastrointestinal: Soft and nontender. No distention.  Musculoskeletal: No lower extremity tenderness nor edema. No gross deformities of extremities. Neurologic:  Normal speech and language. No gross focal neurologic deficits are appreciated.  5/5 strength in the bilateral upper and lower extremities.  No pronator drift.  Normal sensation to the left face, arm, leg in comparison to the right. Skin:  Skin is warm, dry and intact. No rash noted.  ____________________________________________   LABS (all labs ordered are listed, but only abnormal results are displayed)  Labs Reviewed  RESP PANEL BY RT-PCR (FLU A&B, COVID) ARPGX2  ETHANOL  PROTIME-INR  APTT  CBC  DIFFERENTIAL  COMPREHENSIVE METABOLIC PANEL  ETHANOL  RAPID URINE DRUG SCREEN, HOSP PERFORMED  URINALYSIS, ROUTINE W REFLEX MICROSCOPIC  TROPONIN I (HIGH SENSITIVITY)   ____________________________________________  EKG   EKG Interpretation  Date/Time:  Saturday November 30 2021 09:39:26 EDT Ventricular Rate:  35  PR Interval:  126 QRS Duration: 105 QT Interval:  422 QTC Calculation: 459 R Axis:   81 Text Interpretation: Sinus rhythm Borderline right axis deviation Confirmed by Alona Bene [phone]) on 11/30/2021 9:54:28 AM        ____________________________________________  RADIOLOGY  No results  found.  ____________________________________________   PROCEDURES  Procedure(s) performed:   Procedures   ____________________________________________   INITIAL IMPRESSION / ASSESSMENT AND PLAN / ED COURSE  Pertinent labs & imaging results that were available during my care of the patient were reviewed by me and considered in my medical decision making (see chart for details).   This patient is Presenting for Evaluation of numbness to face/arm, which does require a range of treatment options, and is a complaint that involves a high risk of morbidity and mortality.  The Differential Diagnoses include TIA/CVA, HTN emergency, ACS, arrhythmia, dehydration, COVID, AKI, electrolyte disturbance, etc.  Critical Interventions-    Medications - No data to display  Reassessment after intervention:     I did obtain Additional Historical Information from daughter at bedside.   I decided to review pertinent External Data, and in summary patient has an ED visit in our system from March 2021 with dizziness.  TIA was considered at that visit but thought less likely.  Outpatient MRI discussed should symptoms not improve.    Clinical Laboratory Tests Ordered, included ***  Radiologic Tests Ordered, included CTA head and neck. I independently interpreted the images and agree with radiology interpretation.   Cardiac Monitor Tracing which shows NSR.   Social Determinants of Health Risk patient is a non-smoker.   Consult complete with  Medical Decision Making: Summary:  Patient presents emergency department for evaluation of numbness to the left face and arm along with dizziness and some gait instability.  No focal neurodeficits on my exam upon arrival.  I do not see indication to activate a code stroke.  VAN score negative. Will obtain CTA head/neck and consider follow up with MRI as TIA is a consideration. Will follow labs and COVID test as well.   Reevaluation with update and discussion  with   ***Considered admission***  Disposition:   ____________________________________________  FINAL CLINICAL IMPRESSION(S) / ED DIAGNOSES  Final diagnoses:  None     NEW OUTPATIENT MEDICATIONS STARTED DURING THIS VISIT:  New Prescriptions   No medications on file    Note:  This document was prepared using Dragon voice recognition software and may include unintentional dictation errors.  Alona Bene, MD, [facility] Emergency Medicine

## 2021-11-30 NOTE — ED Notes (Signed)
patient wheeled to bathroom in wheelchair, dizzy with ambulation, MD made aware, patient transported back to bed safely

## 2021-11-30 NOTE — Consult Note (Signed)
Neurology Consultation  Reason for Consult: Dizziness, numbness, gait difficulty - Tx from [facility] Referring Physician: Dr. Pearson Grippe, hospitalist  CC: Dizziness, numbness, gait difficulty  History is obtained from: Chart, family  HPI: Lauren Rice is a 81 y.o. female past medical history of hypertension, gastroesophageal reflux presented to the emergency room for evaluation of dizziness, numbness and gait difficulty. Last known well is somewhat difficult to ascertain due to below reasons: Started feeling somewhat unwell starting Thursday.  Initially blood pressures were low, got a new blood pressure cuff and then noted that blood pressures were high.  Went to bed in decent state of health-although not normal Friday, 11/29/2021 night at 9:30 PM and woke up the morning of 11/30/2021 with inability to walk properly going to the bathroom.  Felt dizzy and lightheaded as well as imbalanced while walking.  She felt that she was walking like a drunk.  She lives by herself so she did not talk so did not know whether her speech was slurred.  Later on family was called who felt that she was not feeling normal, she felt that her head did not feel right.  She was also having a headache for the past 2 days.  She is not someone who gets headaches usually.  When the family tried to walk her, she was very unsteady on her feet.  She also complained of feeling numbness in all of her head and some numbness on hemibody.  The numbness symptoms have resolved but she still does not feel that her head is in the right place and the family reports that the last trip that it took to the bathroom, she required to be put in a wheelchair to be taken to the bathroom because she was very unsteady on her feet.   LKW: At best 9:30 PM 11/29/2021 IV thrombolysis given?: no, outside the window Premorbid modified Rankin scale (mRS): 0-complete independent, lives by herself, drives, takes care of her finances herself  ROS: Full ROS was  performed and is negative except as noted in the HPI.   Past Medical History:  Diagnosis Date   Allergic reaction to alpha-gal    cannot eat beef or pork   GERD (gastroesophageal reflux disease)    Hypertension    IBS (irritable bowel syndrome)    Microscopic colitis      Family History  Problem Relation Age of Onset   Heart disease Mother    Emphysema Father    Crohn's disease Sister      Social History:   reports that she has never smoked. She has never used smokeless tobacco. She reports that she does not drink alcohol and does not use drugs.  Medications  Current Facility-Administered Medications:    [START ON 12/01/2021]  stroke: early stages of recovery book, , Does not apply, Once, Pearson Grippe, MD   acetaminophen (TYLENOL) tablet 650 mg, 650 mg, Oral, Q4H PRN **OR** acetaminophen (TYLENOL) 160 MG/5ML solution 650 mg, 650 mg, Per Tube, Q4H PRN **OR** acetaminophen (TYLENOL) suppository 650 mg, 650 mg, Rectal, Q4H PRN, Pearson Grippe, MD   aspirin suppository 300 mg, 300 mg, Rectal, Daily **OR** aspirin tablet 325 mg, 325 mg, Oral, Daily, Pearson Grippe, MD, 325 mg at 11/30/21 1731   atorvastatin (LIPITOR) tablet 80 mg, 80 mg, Oral, QHS, Pearson Grippe, MD   hydrALAZINE (APRESOLINE) injection 5 mg, 5 mg, Intravenous, Q6H PRN, Pearson Grippe, MD   senna-docusate (Senokot-S) tablet 1 tablet, 1 tablet, Oral, QHS PRN, Pearson Grippe,  MD   Exam: Current vital signs: BP 125/70 (BP Location: Right Arm)   Pulse 90   Temp 98.7 F (37.1 C) (Oral)   Resp 18   Ht 5\' 5"  (1.651 m)   Wt 64.9 kg   SpO2 99%   BMI 23.80 kg/m  Vital signs in last 24 hours: Temp:  [97.8 F (36.6 C)-98.7 F (37.1 C)] 98.7 F (37.1 C) (08/12 2137) Pulse Rate:  [61-90] 90 (08/12 2137) Resp:  [12-23] 18 (08/12 2137) BP: (111-157)/(62-76) 125/70 (08/12 2137) SpO2:  [97 %-100 %] 99 % (08/12 2137) Weight:  [64.9 kg] 64.9 kg (08/12 0936) GENERAL: Awake, alert in NAD HEENT: - Normocephalic and atraumatic, dry mm, no  LN++, no Thyromegally LUNGS - Clear to auscultation bilaterally with no wheezes CV - S1S2 RRR, no m/r/g, equal pulses bilaterally. ABDOMEN - Soft, nontender, nondistended with normoactive BS Ext: warm, well perfused, intact peripheral pulses, no Nema  NEURO:  Mental Status: AA&Ox3  Language: speech is nondysarthric.  Naming, repetition, fluency, and comprehension intact. Cranial Nerves: PERRL EOMI, visual fields full, no facial asymmetry, facial sensation intact, hearing intact, tongue/uvula/soft palate midline, normal sternocleidomastoid and trapezius muscle strength. No evidence of tongue atrophy or fibrillations Motor: 5/5 with no drift in any 4 Tone: is normal and bulk is normal Sensation- Intact to light touch bilaterally Coordination: FTN intact bilaterally, no ataxia in BLE. Gait- deferred NIHSS-0  Labs I have reviewed labs in epic and the results pertinent to this consultation are:  CBC    Component Value Date/Time   WBC 5.1 11/30/2021 0948   RBC 3.95 11/30/2021 0948   HGB 13.0 11/30/2021 0948   HCT 38.9 11/30/2021 0948   PLT 161 11/30/2021 0948   MCV 98.5 11/30/2021 0948   MCH 32.9 11/30/2021 0948   MCHC 33.4 11/30/2021 0948   RDW 13.2 11/30/2021 0948   LYMPHSABS 1.4 11/30/2021 0948   MONOABS 0.5 11/30/2021 0948   EOSABS 0.1 11/30/2021 0948   BASOSABS 0.1 11/30/2021 0948    CMP     Component Value Date/Time   NA 137 11/30/2021 0948   K 3.8 11/30/2021 0948   CL 105 11/30/2021 0948   CO2 25 11/30/2021 0948   GLUCOSE 106 (H) 11/30/2021 0948   BUN 21 11/30/2021 0948   CREATININE 0.79 11/30/2021 0948   CALCIUM 9.0 11/30/2021 0948   PROT 7.8 11/30/2021 0948   ALBUMIN 4.2 11/30/2021 0948   AST 26 11/30/2021 0948   ALT 16 11/30/2021 0948   ALKPHOS 64 11/30/2021 0948   BILITOT 0.7 11/30/2021 0948   GFRNONAA >60 11/30/2021 0948   GFRAA >60 06/27/2019 1755    Imaging I have reviewed the images obtained:  CT head with no acute changes.  Stable since 2021 CT  angiography negative for LVO.  Arterial tortuosity but mild for age atherosclerosis in the head and neck.  No significant stenosis.    Assessment:  81 year old fairly independent woman with past medical history of hypertension presented to the emergency room for evaluation of dizziness, gait instability and headache along with a general sensation of just not feeling right more so while walking. Neurological exam on the bed is unremarkable but she has significant gait ataxia per family. I suspect she might have had a midline cerebellar infarct or a small brainstem infarct that might be causing her to have dizziness, headache and gait ataxia. Needs further work-up  Impression: Evaluate for posterior circulation stroke/TIA  Recommendations: Admit to hospitalist Frequent neurochecks Telemetry Aspirin 81 and Plavix  75 Atorvastatin 80 2D echo Lipid panel A1c PT OT Speech therapy Permissive hypertension-allow for systolic blood pressures to be as high as 220 for another day or so and then start normalizing blood pressures with an eventual goal of normotension on discharge. Stroke team will follow with you.=   -- Milon Dikes, MD Neurologist [facility] Pager: [phone]

## 2021-11-30 NOTE — H&P (Signed)
History and Physical    Patient: Lauren Rice EUM:[id number] DOB: October 25, 1940 DOA: 11/30/2021 DOS: the patient was seen and examined on 11/30/2021 PCP: Sherrilee Gilles, DO  Dr. Audie Pinto (allerghist)  Patient coming from: home   Chief Complaint:  Chief Complaint  Patient presents with   Numbness    Face & left arm    HPI: Lauren Rice is a 81 y.o. female with medical history significant of hypertension, microscopic colitis, c/o dizziness (not vertigo) starting this  7am.  187/87 per patient,  this was before she took her bp medication.  Left cheek and left arm felt  "funny" .  Pt states that still feels odd presently.   Pt notes headache which began around the time of her dizziness.  Pt denies vision change, dysarthria, cp, palp, sob, n/v, diarrhea, brbpr,  focal weakness.    In ED, T 97.8, P 71 R 12, Bp 157/71 pox 99% CT brain IMPRESSION: 1. No CT evidence for acute intracranial abnormality. 2. Sinusitis   CTA head/neck: IMPRESSION: 1. Negative for large vessel occlusion. 2. Arterial tortuosity but mild for age atherosclerosis in the head and neck. No significant arterial stenosis. 3. Stable since 2021 and normal for age CT appearance of the brain. 4. Chronic paranasal sinus disease.  Na 137, K 3.8 Bun 21, Creatinine 0.79 Ast 26, Alt 16 Wbc 5.1, Hgb 12.9, Plt 173  Urinalysis negative ETOH <10, UDS negative Covid negative, Influenza negative  Ekg nsr at 70, nl axis, nl int, no st-t changes c/w ischemia  ED spoke with neurology at [facility] Leonel Ramsay), ?TIA , will transfer to [facility] as there is no MRI service at [facility] over the weekend.     Review of Systems: negative for all organ systems except for + above   Past Medical History:  Diagnosis Date   Allergic reaction to alpha-gal    cannot eat beef or pork   GERD (gastroesophageal reflux disease)    Hypertension    IBS (irritable bowel syndrome)    Microscopic colitis    Past Surgical History:  Procedure  Laterality Date   APPENDECTOMY     OVARIAN CYST REMOVAL     Social History:  reports that she has never smoked. She has never used smokeless tobacco. She reports that she does not drink alcohol and does not use drugs.  Family History  Problem Relation Age of Onset   Heart disease Mother    Emphysema Father    Crohn's disease Sister     Allergies  Allergen Reactions   Beef-Derived Products     Pt has Alpha-gal and cannot eat beef or pork   Advil [Ibuprofen] Cough   Pork-Derived Products     Pt has Alpha-gal and cannot eat beef or pork   Prior to Admission medications   Medication Sig Start Date End Date Taking? Authorizing Provider  acetaminophen (TYLENOL) 325 MG tablet Take 650 mg by mouth every 6 (six) hours as needed.   Yes [provider]  albuterol (PROAIR HFA) 108 (90 Base) MCG/ACT inhaler Inhale 1 puff into the lungs every 6 (six) hours as needed for shortness of breath.   Yes [provider]  Biotin 1000 MCG tablet Take by mouth. 06/21/21  Yes [provider]  EPINEPHrine 0.3 mg/0.3 mL IJ SOAJ injection Inject 0.3 mg into the muscle as needed for anaphylaxis. 07/29/21  Yes [provider]  fluticasone (FLONASE) 50 MCG/ACT nasal spray Place 1 spray into both nostrils daily.  Yes [provider]  fluticasone (FLOVENT HFA) 44 MCG/ACT inhaler Inhale 1 puff into the lungs in the morning and at bedtime.   Yes [provider]  lisinopril (ZESTRIL) 20 MG tablet Take 20 mg by mouth daily. 05/06/19  Yes [provider]  loratadine (CLARITIN) 10 MG tablet Take 10 mg by mouth daily. 01/26/17  Yes [provider]  Multiple Vitamins-Minerals (CENTRUM ULTRA WOMENS PO) Take by mouth daily.   Yes [provider]  omeprazole (PRILOSEC) 20 MG capsule Take 20 mg by mouth every other day. 02/05/21  Yes [provider]    Physical Exam: Vitals:   11/30/21 1230 11/30/21 1245 11/30/21 1300 11/30/21 1400  BP:  126/69  124/65 (!) 146/64  Pulse: 65 65 61 66  Resp: 19 15 17 18   Temp: 97.8 F (36.6 C)     TempSrc:      SpO2: 97% 97% 97% 100%  Weight:      Height:       Heent: anicteric, pupils 1.71m , direct, consensual, near intact Neck: no jvd, no bruit, no tm Heart: rrr s1, s2, no m/g/r Lung: ctab Abd: soft, nt, nd, +bs Ext: no c/c/e Neuro: cn2-12 intact, reflexes 2+, symmetric, diffuse with downgoing toes bilaterally, motor 5/5 in all 4 ext Pinprik intact Skin:  no rash Lymph: no obvious adenopathy  Data Reviewed: Reviewed CT brain, CTA neck  Assessment and Plan:   L facial (cheek) and L arm parasthesia Headache TIA  Tele Check MRI brain Check cardiac echo Check lipid, tsh, esr, rpr in am Start aspirin 3276mpo qday Start Lipitor 8052mo qhs PT/OT consult [facility] with KirLeonel RamsayHypertension Cont Lisinopril 34m40m qday Check cbc, cmp in am  Gerd Cont PPI  Hayfever Cont Claritin 10mg72mqday Cont Flonase    Advance Care Planning:  FULL CODE  Consults: please consult neurology when arrives at [facility]  [facility] Communication: w daughter, present in room  Severity of Illness: Observation, pt will require likely <2 nites stay for evaluation of TIA, if pt has CVA on MRI brain may require further w/up requiring inpatient admission   Author: JamesJani Gravel8/03/2022 4:07 PM  For on call review [email or web address]

## 2021-11-30 NOTE — ED Triage Notes (Signed)
Pt reports she woke up this morning and felt dizzy, nauseated, her head and left arm felt "tingly" and her BP was high 165/87 at 0630. Pt went to bed at 2130 last night and felt normal prior to going to bed.

## 2021-12-01 ENCOUNTER — Observation Stay (HOSPITAL_COMMUNITY): Payer: Medicare Other

## 2021-12-01 ENCOUNTER — Observation Stay (HOSPITAL_BASED_OUTPATIENT_CLINIC_OR_DEPARTMENT_OTHER): Payer: Medicare Other

## 2021-12-01 DIAGNOSIS — G459 Transient cerebral ischemic attack, unspecified: Secondary | ICD-10-CM | POA: Diagnosis not present

## 2021-12-01 DIAGNOSIS — I10 Essential (primary) hypertension: Secondary | ICD-10-CM | POA: Diagnosis not present

## 2021-12-01 DIAGNOSIS — K219 Gastro-esophageal reflux disease without esophagitis: Secondary | ICD-10-CM | POA: Diagnosis not present

## 2021-12-01 LAB — LIPID PANEL
Cholesterol: 201 mg/dL — ABNORMAL HIGH (ref 0–200)
Cholesterol: 201 mg/dL — ABNORMAL HIGH (ref 0–200)
HDL: 36 mg/dL — ABNORMAL LOW (ref >40)
HDL: 36 mg/dL — ABNORMAL LOW (ref >40)
LDL Cholesterol: 121 mg/dL — ABNORMAL HIGH (ref 0–99)
LDL Cholesterol: 124 mg/dL — ABNORMAL HIGH (ref 0–99)
Total CHOL/HDL Ratio: 5.6 RATIO
Total CHOL/HDL Ratio: 5.6 RATIO
Triglycerides: 221 mg/dL — ABNORMAL HIGH (ref <150)
Triglycerides: 205 mg/dL — ABNORMAL HIGH (ref <150)
VLDL: 44 mg/dL — ABNORMAL HIGH (ref 0–40)
VLDL: 41 mg/dL — ABNORMAL HIGH (ref 0–40)

## 2021-12-01 LAB — COMPREHENSIVE METABOLIC PANEL
ALT: 15 U/L (ref 0–44)
AST: 22 U/L (ref 15–41)
Albumin: 3.7 g/dL (ref 3.5–5.0)
Alkaline Phosphatase: 66 U/L (ref 38–126)
Anion gap: 8 (ref 5–15)
BUN: 18 mg/dL (ref 8–23)
CO2: 25 mmol/L (ref 22–32)
Calcium: 8.7 mg/dL — ABNORMAL LOW (ref 8.9–10.3)
Chloride: 104 mmol/L (ref 98–111)
Creatinine, Ser: 0.89 mg/dL (ref 0.44–1.00)
GFR, Estimated: >60 mL/min (ref >60)
Glucose, Bld: 114 mg/dL — ABNORMAL HIGH (ref 70–99)
Potassium: 3.6 mmol/L (ref 3.5–5.1)
Sodium: 137 mmol/L (ref 135–145)
Total Bilirubin: 0.5 mg/dL (ref 0.3–1.2)
Total Protein: 6.9 g/dL (ref 6.5–8.1)

## 2021-12-01 LAB — SEDIMENTATION RATE: Sed Rate: 14 mm/hr (ref 0–22)

## 2021-12-01 LAB — ECHOCARDIOGRAM COMPLETE
Area-P 1/2: 3.42 cm2
Height: 65 in
S' Lateral: 2.3 cm
Weight: 2288 oz

## 2021-12-01 LAB — HEMOGLOBIN A1C
Hgb A1c MFr Bld: 5.7 % — ABNORMAL HIGH (ref 4.8–5.6)
Hgb A1c MFr Bld: 5.6 % (ref 4.8–5.6)
Mean Plasma Glucose: 116.89 mg/dL
Mean Plasma Glucose: 114.02 mg/dL

## 2021-12-01 LAB — TSH: TSH: 5.91 u[IU]/mL — ABNORMAL HIGH (ref 0.350–4.500)

## 2021-12-01 LAB — HIV ANTIBODY (ROUTINE TESTING W REFLEX): HIV Screen 4th Generation wRfx: NONREACTIVE

## 2021-12-01 LAB — RPR: RPR Ser Ql: NONREACTIVE

## 2021-12-01 MED ORDER — CLOPIDOGREL BISULFATE 75 MG PO TABS: 75.0000 mg | ORAL_TABLET | Freq: Every day | ORAL | 0 refills | Status: AC

## 2021-12-01 MED ORDER — ASPIRIN 81 MG PO CHEW
81.0000 mg | CHEWABLE_TABLET | Freq: Every day | ORAL | Status: DC
  Administered 2021-12-01: 81 mg via ORAL
  Filled 2021-12-01: qty 1

## 2021-12-01 MED ORDER — ASPIRIN 81 MG PO TBEC: 81.0000 mg | DELAYED_RELEASE_TABLET | Freq: Every day | ORAL | 2 refills | Status: AC

## 2021-12-01 MED ORDER — ATORVASTATIN CALCIUM 40 MG PO TABS: 40.0000 mg | ORAL_TABLET | Freq: Every day | ORAL | 2 refills | Status: AC

## 2021-12-01 NOTE — Evaluation (Signed)
Physical Therapy Evaluation Patient Details Name: Lauren Rice MRN: [id number] DOB: [birth year] Today's Date: 12/01/2021  History of Present Illness  Lauren Rice is a 81 y.o. female who presented with reorts of dizziness (not vertigo) starting 8/12 at  7am; Left cheek and left arm felt  "funny"; Evaluate for posterior circulation stroke/TIA; with medical history significant of hypertension, microscopic colitis  Clinical Impression   Pt admitted with above diagnosis. Lives at home alone, in a single-level home with 6 steps to enter; Prior to admission, pt was independent, driving, shopping, enjoys attending [employer]; Presents to PT with gait instability, decr stability in L single limb stance, decr gait speed, decr coordination with gait;  At this point, it is worth considering having pt stay with her daughter (son-in-law works at home) initially; Recommend Outpt PT for gait and balance dysfunction; Pt currently with functional limitations due to the deficits listed below (see PT Problem List). Pt will benefit from skilled PT to increase their independence and safety with mobility to allow discharge to the venue listed below.          Recommendations for follow up therapy are one component of a multi-disciplinary discharge planning process, led by the attending physician.  Recommendations may be updated based on patient status, additional functional criteria and insurance authorization.  Follow Up Recommendations Outpatient PT      Assistance Recommended at Discharge Set up Supervision/Assistance  Patient can return home with the following  Assistance with cooking/housework;Assist for transportation    Equipment Recommendations Gil[place] Mor (Might benefit form a single point cane)  Recommendations for Other Services  OT consult    Functional Status Assessment Patient has had a recent decline in their functional status and demonstrates the ability to make significant improvements in  function in a reasonable and predictable amount of time.     Precautions / Restrictions Precautions Precautions: Fall Restrictions Weight Bearing Restrictions: No      Mobility  Bed Mobility Overal bed mobility: Modified Independent                  Transfers Overall transfer level: Needs assistance Equipment used: None Transfers: Sit to/from Stand Sit to Stand: Supervision           General transfer comment: Close guard initially for safety; overall good rise    Ambulation/Gait Ambulation/Gait assistance: Min guard (with and without physical contact) Gait Distance (Feet): 300 Feet Assistive device: None, Straight cane Gait Pattern/deviations: Decreased stance time - left Gait velocity: Pt agrees her gait speed is noticeably slower than usual     General Gait Details: Slow pace, with notable erratic step length and width during R foot advancement; no overt loss of balance without assistive device; walked back to the room with a single point cane in R hand, without much difference in gait pattern  Stairs Stairs: Yes Stairs assistance: Supervision Stair Management: Two rails, Alternating pattern, Step to pattern, Forwards (alternating ascending, step-by-step descending) Number of Stairs: 5 General stair comments: Pt is able to ascend without rails without loss of balance; she appropriately uses rails while descending - reports she feel smore secure that way  Wheelchair Mobility    Modified Rankin (Stroke Patients Only) Modified Rankin (Stroke Patients Only) Pre-Morbid Rankin Score: No symptoms Modified Rankin: Slight disability     Balance Overall balance assessment: Needs assistance           Standing balance-Leahy Scale: Fair Standing balance comment: Pt states she does not feel "normal"  in standing, and that she feel sher balance is off   Single Leg Stance - Left Leg: 2 (and lost her balance, using a stepping strategy to regain her balance)                          Pertinent Vitals/Pain Pain Assessment Pain Assessment: No/denies pain    Home Living Family/patient expects to be discharged to:: Private residence Living Arrangements: Alone Available Help at Discharge: Family;Other (Comment) (Possible that pt can stay with her daughter at discharge if she needs) Type of Home: House Home Access: Stairs to enter Entrance Stairs-Rails: Right;Left;Can reach both Entrance Stairs-Number of Steps: 6   Home Layout: One level Home Equipment: None (can borrow 3in1-type seat for shower from her daughter)      Prior Function Prior Level of Function : Independent/Modified Independent             Mobility Comments: no assistive device, enjoys going to [place] with family ADLs Comments: Independent     Hand Dominance   Dominant Hand: Right    Extremity/Trunk Assessment   Upper Extremity Assessment Upper Extremity Assessment: Defer to OT evaluation    Lower Extremity Assessment Lower Extremity Assessment: LLE deficits/detail LLE Deficits / Details: Noting decr gross coordination, including decr stance stabiltiy leading to slight, but notably erratic R foot advancement and placement LLE Coordination: decreased gross motor    Cervical / Trunk Assessment Cervical / Trunk Assessment: Normal  Communication   Communication: No difficulties  Cognition Arousal/Alertness: Awake/alert Behavior During Therapy: WFL for tasks assessed/performed Overall Cognitive Status: Impaired/Different from baseline Area of Impairment: Safety/judgement                         Safety/Judgement: Decreased awareness of safety, Decreased awareness of deficits     General Comments: Pt recognizes that her balance, amb, and gait speed are off -- seems hesitant to agree to more therapy to work on it        [employer] Comments General comments (skin integrity, edema, etc.): Pt's daughter present and helpful during session     Exercises     Assessment/Plan    PT Assessment Patient needs continued PT services  PT Problem List Decreased activity tolerance;Decreased balance;Decreased mobility;Decreased coordination;Decreased cognition;Decreased knowledge of use of DME;Decreased safety awareness       PT Treatment Interventions DME instruction;Gait training;Stair training;Functional mobility training;Therapeutic activities;Therapeutic exercise;Balance training;Neuromuscular re-education;Cognitive remediation;Patient/family education    PT Goals (Current goals can be found in the Care Plan section)  Acute Rehab PT Goals Patient Stated Goal: To get back to normal PT Goal Formulation: With patient Time For Goal Achievement: 12/15/21 Potential to Achieve Goals: Good    Frequency Min 4X/week     Co-evaluation               AM-PAC PT "6 Clicks" Mobility  Outcome Measure Help needed turning from your back to your side while in a flat bed without using bedrails?: None Help needed moving from lying on your back to sitting on the side of a flat bed without using bedrails?: None Help needed moving to and from a bed to a chair (including a wheelchair)?: None Help needed standing up from a chair using your arms (e.g., wheelchair or bedside chair)?: None Help needed to walk in hospital room?: A Little Help needed climbing 3-5 steps with a railing? : A Little 6 Click Score: 22    End  of Session Equipment Utilized During Treatment: Gait belt Activity Tolerance: Patient tolerated treatment well Patient left: in chair;with call bell/phone within reach;Other (comment) (about to go to MRI) Nurse Communication: Mobility status PT Visit Diagnosis: Unsteadiness on feet (R26.81);Other abnormalities of gait and mobility (R26.89)    Time: 8867-7373 PT Time Calculation (min) (ACUTE ONLY): 28 min   Charges:   PT Evaluation $PT Eval Low Complexity: 1 Low PT Treatments $Gait Training: 8-22 mins        Van Clines, PT  Acute Rehabilitation Services Office [phone]   Levi Aland 12/01/2021, 8:41 AM

## 2021-12-01 NOTE — TOC Transition Note (Signed)
Transition of Care [facility]) - CM/SW Discharge Note   Patient Details  Name: Lauren Rice MRN: [id number] Date of Birth: Aug 23, 1940  Transition of Care [facility]) CM/SW Contact:  Lawerance Sabal, RN Phone Number: 12/01/2021, 2:38 PM   Clinical Narrative:    RW delivered to the room Spoke w patient, who deferred to daughter. Discussed locations for neuro rehab, referral made to [facility]. They are aware to call Monday to expedite scheduling.     Final next level of care: Home/Self Care Barriers to Discharge: No Barriers Identified   Patient Goals and CMS Choice Patient states their goals for this hospitalization and ongoing recovery are:: to go home CMS Medicare.gov Compare Post Acute Care list provided to:: Patient Choice offered to / list presented to : Patient  Discharge Placement                       Discharge Plan and Services                DME Arranged: Walker rolling DME Agency: [employer] Date DME Agency Contacted: 12/01/21 Time DME Agency Contacted: 1230 Representative spoke with at DME Agency: [email or web address]            Social Determinants of Health (SDOH) Interventions     Readmission Risk Interventions     No data to display

## 2021-12-01 NOTE — Evaluation (Signed)
Occupational Therapy Evaluation Patient Details Name: Lauren Rice MRN: [id number] DOB: 1940/05/22 Today's Date: 12/01/2021   History of Present Illness Lauren Rice is a 81 y.o. 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 Medical history significant of hypertension, microscopic colitis   Clinical Impression   PTA pt lives alone independently. Pt complaining of "dizziness" and "not feeling right". Dizziness worse with mobility, not just positional changes of head. Appears to worsen with separation of head/eye movement. BP sitting 135/61; standing 129/71. Mild Unsteady gait, which improves with use of RW. Daughter present and plan is to DC to her house while she recuperates. Educated daughter/pt on strategies to reduce risk of falls. Recommend follow up with OT at the neuro outpt center to maximize independence with IADL tasks to facilitate return to independent living. Educated pt/daughter on signs/symptoms of CVA using [employer]. Acute OT will follow.      Recommendations for follow up therapy are one component of a multi-disciplinary discharge planning process, led by the attending physician.  Recommendations may be updated based on patient status, additional functional criteria and insurance authorization.   Follow Up Recommendations  Outpatient OT    Assistance Recommended at Discharge Frequent or constant Supervision/Assistance  Patient can return home with the following A little help with walking and/or transfers;A little help with bathing/dressing/bathroom;Assistance with cooking/housework;Direct supervision/assist for medications management;Direct supervision/assist for financial management;Assist for transportation;Help with stairs or ramp for entrance    Functional Status Assessment  Patient has had a recent decline in  their functional status and demonstrates the ability to make significant improvements in function in a reasonable and predictable amount of time.  Equipment Recommendations  Other (comment) (RW)    Recommendations for Other Services       Precautions / Restrictions Precautions Precautions: Fall      Mobility Bed Mobility Overal bed mobility: Modified Independent                  Transfers Overall transfer level: Needs assistance Equipment used: None Transfers: Sit to/from Stand Sit to Stand: Supervision           General transfer comment: Close guard initially for safety; overall good rise      Balance Overall balance assessment: Needs assistance           Standing balance-Leahy Scale: Fair     S                       ADL either performed or assessed with clinical judgement   ADL Overall ADL's : Needs assistance/impaired                                     Functional mobility during ADLs: Min guard General ADL Comments: Pt overall set up with UB; [place]inguard with LB ADL tasks and ambulation; does better with RW; mild unsteadiness; Educated on strategies to reduce risk of falls     Vision Baseline Vision/History: 1 Wears glasses Additional Comments: vision appears at baseline     Perception Perception Comments: [facility]   Praxis      Pertinent Vitals/Pain Pain Assessment Pain Assessment: No/denies pain     Hand Dominance Right   Extremity/Trunk Assessment Upper Extremity Assessment Upper Extremity  Assessment: Overall WFL for tasks assessed   Lower Extremity Assessment Lower Extremity Assessment: Defer to PT evaluation   Cervical / Trunk Assessment Cervical / Trunk Assessment: Other exceptions   Communication Communication Communication: No difficulties   Cognition Arousal/Alertness: Awake/alert Behavior During Therapy: WFL for tasks assessed/performed Overall Cognitive Status: Impaired/Different from  baseline Area of Impairment: Safety/judgement, Attention                   Current Attention Level: Selective     Safety/Judgement: Decreased awareness of safety, Decreased awareness of deficits     General Comments: slow processing noted - most likely baseline; pt states she did not sleep well     General Comments       Exercises Exercises: Other exercises Other Exercises Other Exercises: VOR habituation exercises; saccades and gaze stabilization with head turns   Shoulder Instructions      Home Living Family/patient expects to be discharged to:: Private residence Living Arrangements: Alone Available Help at Discharge: Family;Other (Comment) (Possible that pt can stay with her daughter at discharge if she needs) Type of Home: House Home Access: Stairs to enter [employer] of Steps: 6 Entrance Stairs-Rails: Right;Left;Can reach both Home Layout: One level     Bathroom Shower/Tub: Producer, television/film/video: Handicapped height     Home Equipment: None (can borrow 3in1-type seat for shower from her daughter)          Prior Functioning/Environment Prior Level of Function : Independent/Modified Independent             Mobility Comments: no assistive device, enjoys going to [employer] with family ADLs Comments: Independent        OT Problem List: Decreased activity tolerance;Impaired balance (sitting and/or standing);Decreased safety awareness;Decreased cognition;Decreased knowledge of use of DME or AE      OT Treatment/Interventions:      OT Goals(Current goals can be found in the care plan section) Acute Rehab OT Goals Patient Stated Goal: to not be dizzy OT Goal Formulation: With patient Time For Goal Achievement: 12/15/21 Potential to Achieve Goals: Good  OT Frequency:      Co-evaluation              AM-PAC OT "6 Clicks" Daily Activity     Outcome Measure Help from another person eating meals?: None Help from another  person taking care of personal grooming?: None Help from another person toileting, which includes using toliet, bedpan, or urinal?: A Little Help from another person bathing (including washing, rinsing, drying)?: A Little Help from another person to put on and taking off regular upper body clothing?: A Little Help from another person to put on and taking off regular lower body clothing?: A Little 6 Click Score: 20   End of Session Equipment Utilized During Treatment: Gait belt;Rolling walker (2 wheels) Nurse Communication: Mobility status  Activity Tolerance: Patient tolerated treatment well Patient left: in bed;with call bell/phone within reach;with family/visitor present  OT Visit Diagnosis: Unsteadiness on feet (R26.81);Other abnormalities of gait and mobility (R26.89);Dizziness and giddiness (R42)                Time: 9735-3299 OT Time Calculation (min): 31 min Charges:  OT General Charges $OT Visit: 1 Visit OT Evaluation $OT Eval Low Complexity: 1 Low  Lauren Rice, OT/L   Acute OT Clinical Specialist Acute Rehabilitation Services Pager [phone] Office [phone]   [facility] 12/01/2021, 1:02 PM

## 2021-12-01 NOTE — Plan of Care (Signed)
  Problem: Clinical Measurements: Goal: Ability to maintain clinical measurements within normal limits will improve Outcome: Progressing Goal: Will remain free from infection Outcome: Progressing Goal: Diagnostic test results will improve Outcome: Progressing Goal: Respiratory complications will improve Outcome: Progressing Goal: Cardiovascular complication will be avoided Outcome: Progressing   Problem: Ischemic Stroke/TIA Tissue Perfusion: Goal: Complications of ischemic stroke/TIA will be minimized Outcome: Progressing   Problem: Safety: Goal: Ability to remain free from injury will improve Outcome: Progressing   

## 2021-12-01 NOTE — Discharge Summary (Signed)
Physician Discharge Summary   Patient: Lauren Rice Wardell MRN: [id number] DOB: Dec 25, 1940  Admit date:     11/30/2021  Discharge date: 12/01/21  Discharge Physician: Rebekah ChesterfieldLaxman Apryle Stowell   PCP: Jonathon BellowsMcGee, Rachel, DO   Recommendations at discharge:   Follow-up with your primary care provider in 1 week. [facility] 4 weeks.  Internal referral has been made. Patient will continue aspirin Plavix for 3 weeks followed by aspirin alone.  Lipitor has been initiated for abnormal lipid panel. Recommend repeating TSH in 4 to 6 weeks.  Discharge Diagnoses: Active Problems:   Gastroesophageal reflux disease   Essential hypertension  Principal Problem (Resolved):   TIA (transient ischemic attack)  Hospital Course: Lauren Rice Megna is a 81 y.o. 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  Denied visual changes.  Denies focal weakness.  In the ED patient was hemodynamically stable.  CT head showed no acute intracranial abnormality.  Patient was then admitted hospital for further evaluation and treatment.   Following conditions were addressed during hospitalization,  L facial  and L arm paraesthesia likely secondary to posterior circulation TIA  Neurology was consulted.  Patient underwent stroke work-up.  Was recommended aspirin Plavix for 3 weeks followed by aspirin alone.  We will start the patient on Lipitor 40 mg daily.  Hemoglobin A1c of 5.6.  Lipid panel showed a total cholesterol of 201 with LDL of 124.  This is mildly elevated at 5.9.  UDS was negative.  UA was negative.  Patient was also seen by PT/OT and recommend outpatient PT OT.  Neurology has recommended follow-up in the clinic in 2 months.   Hypertension Resume lisinopril from home.  History of GERD Continue Protonix.  Consultants: Neurology  Procedures performed: None  Disposition: Home.  Spoke  with the patient's daughter at bedside.  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   aspirin EC 81 MG tablet Take 1 tablet (81 mg total) by mouth daily. Swallow whole.   atorvastatin 40 MG tablet Commonly known as: LIPITOR Take 1 tablet (40 mg total) by mouth at bedtime.   Biotin 1000 MCG tablet Take by mouth.   CENTRUM ULTRA WOMENS PO Take by mouth daily.   clopidogrel 75 MG tablet Commonly known as: PLAVIX Take 1 tablet (75 mg total) by mouth daily for 21 days. Start taking on: December 02, 2021   EPINEPHrine 0.3 mg/0.3 mL Soaj injection Commonly known as: EPI-PEN Inject 0.3 mg into the muscle as needed for anaphylaxis.   fluticasone 44 MCG/ACT inhaler Commonly known as: FLOVENT HFA Inhale 1 puff into the lungs in the morning and at bedtime.  fluticasone 50 MCG/ACT nasal spray Commonly known as: FLONASE Place 1 spray into both nostrils daily.   lisinopril 20 MG tablet Commonly known as: ZESTRIL Take 20 mg by mouth daily.   loratadine 10 MG tablet Commonly known as: CLARITIN Take 10 mg by mouth daily.   omeprazole 20 MG capsule Commonly known as: PRILOSEC Take 20 mg by mouth every other day.   ProAir HFA 108 (90 Base) MCG/ACT inhaler Generic drug: albuterol Inhale 1 puff into the lungs every 6 (six) hours as needed for shortness of breath.               Durable Medical Equipment  (From admission,  onward)           Start     Ordered   12/01/21 1309  For home use only DME Walker rolling  Once       Question Answer Comment  Walker: With 5 Inch Wheels   Patient needs a walker to treat with the following condition Weakness      12/01/21 1309             Subjective. Today, patient feels better.  Complains of less dizziness and numbness.  Patient's daughter at bedside.  Discharge Exam: Filed Weights   11/30/21 0936  Weight: 64.9 kg      12/01/2021   12:52 PM 12/01/2021    8:16 AM 12/01/2021    4:00 AM  Vitals with BMI  Systolic 112 116 161  Diastolic 57 62 61  Pulse 69 64 64    General:  Average built, not in obvious distress HENT:   No scleral pallor or icterus noted. Oral mucosa is moist.  Chest:  Clear breath sounds.  Diminished breath sounds bilaterally. No crackles or wheezes.  CVS: S1 &S2 heard. No murmur.  Regular rate and rhythm. Abdomen: Soft, nontender, nondistended.  Bowel sounds are heard.   Extremities: No cyanosis, clubbing or edema.  Peripheral pulses are palpable. Psych: Alert, awake and oriented, normal mood CNS:  No cranial nerve deficits.  Moves all extremities. Skin: Warm and dry.  No rashes noted.   Condition at discharge: good  The results of significant diagnostics from this hospitalization (including imaging, microbiology, ancillary and laboratory) are listed below for reference.   Imaging Studies: ECHOCARDIOGRAM COMPLETE  Result Date: 12/01/2021    ECHOCARDIOGRAM REPORT   Patient Name:   THETA LEAF [facility] Date of Exam: 12/01/2021 Medical Rec #:  [id number]           Height:       65.0 in Accession #:    [id number]          Weight:       143.0 lb Date of Birth:  1940-04-28           BSA:          1.715 m Patient Age:    81 years            BP:           116/62 mmHg Patient Gender: F                   HR:           63 bpm. Exam Location:  Inpatient Procedure: 2D Echo Indications:    TIA  History:        Patient has no prior history of  Echocardiogram examinations.  Risk Factors:Hypertension.  Sonographer:    Delcie Roch RDCS Referring Phys: [phone] JAMES KIM IMPRESSIONS  1. Left ventricular ejection fraction, by estimation, is 60 to 65%. The left ventricle has normal function. The left ventricle has no regional wall motion abnormalities. Left ventricular diastolic parameters are consistent with Grade I diastolic dysfunction (impaired relaxation).  2. Right ventricular systolic function is normal. The right ventricular size is normal. There is normal pulmonary artery systolic pressure. The estimated right ventricular systolic pressure is 14.2 mmHg.  3. The mitral valve is grossly normal. Trivial mitral valve regurgitation. No evidence of mitral stenosis.  4. The aortic valve is tricuspid. Aortic valve regurgitation is not visualized. No aortic stenosis is present.  5. The inferior vena cava is normal in size with greater than 50% respiratory variability, suggesting right atrial pressure of 3 mmHg. Conclusion(s)/Recommendation(s): No intracardiac source of embolism detected on this transthoracic study. Consider a transesophageal echocardiogram to exclude cardiac source of embolism if clinically indicated. FINDINGS  Left Ventricle: Left ventricular ejection fraction, by estimation, is 60 to 65%. The left ventricle has normal function. The left ventricle has no regional wall motion abnormalities. The left ventricular internal cavity size was normal in size. There is  no left ventricular hypertrophy. Left ventricular diastolic parameters are consistent with Grade I diastolic dysfunction (impaired relaxation). Right Ventricle: The right ventricular size is normal. No increase in right ventricular wall thickness. Right ventricular systolic function is normal. There is normal pulmonary artery systolic pressure. The tricuspid regurgitant velocity is 1.67 m/s, and  with an assumed right atrial pressure of 3 mmHg, the estimated right  ventricular systolic pressure is 14.2 mmHg. Left Atrium: Left atrial size was normal in size. Right Atrium: Right atrial size was normal in size. Pericardium: Trivial pericardial effusion is present. Presence of epicardial fat layer. Mitral Valve: The mitral valve is grossly normal. Trivial mitral valve regurgitation. No evidence of mitral valve stenosis. Tricuspid Valve: The tricuspid valve is grossly normal. Tricuspid valve regurgitation is trivial. No evidence of tricuspid stenosis. Aortic Valve: The aortic valve is tricuspid. Aortic valve regurgitation is not visualized. No aortic stenosis is present. Pulmonic Valve: The pulmonic valve was grossly normal. Pulmonic valve regurgitation is trivial. No evidence of pulmonic stenosis. Aorta: The aortic root and ascending aorta are structurally normal, with no evidence of dilitation. Venous: The inferior vena cava is normal in size with greater than 50% respiratory variability, suggesting right atrial pressure of 3 mmHg. IAS/Shunts: The atrial septum is grossly normal.  LEFT VENTRICLE PLAX 2D LVIDd:         3.90 cm   Diastology LVIDs:         2.30 cm   LV e' medial:    6.53 cm/s LV PW:         1.00 cm   LV E/e' medial:  9.2 LV IVS:        1.00 cm   LV e' lateral:   6.85 cm/s LVOT diam:     1.70 cm   LV E/e' lateral: 8.8 LV SV:         40 LV SV Index:   23 LVOT Area:     2.27 cm  RIGHT VENTRICLE             IVC RV S prime:     15.60 cm/s  IVC diam: 1.50 cm TAPSE (M-mode): 2.0 cm LEFT ATRIUM             Index  RIGHT ATRIUM           Index LA diam:        3.60 cm 2.10 cm/m   RA Area:     13.80 cm LA Vol (A2C):   47.8 ml 27.87 ml/m  RA Volume:   30.50 ml  17.78 ml/m LA Vol (A4C):   35.3 ml 20.58 ml/m LA Biplane Vol: 42.3 ml 24.66 ml/m  AORTIC VALVE LVOT Vmax:   90.70 cm/s LVOT Vmean:  57.500 cm/s LVOT VTI:    0.175 m  AORTA Ao Root diam: 2.90 cm Ao Asc diam:  3.10 cm MITRAL VALVE               TRICUSPID VALVE MV Area (PHT): 3.42 cm    TR Peak grad:   11.2  mmHg MV Decel Time: 222 msec    TR Vmax:        167.00 cm/s MV E velocity: 60.20 cm/s MV A velocity: 54.10 cm/s  SHUNTS MV E/A ratio:  1.11        Systemic VTI:  0.18 m                            Systemic Diam: 1.70 cm Lennie Odor MD Electronically signed by Lennie Odor MD Signature Date/Time: 12/01/2021/11:06:17 AM    Final    MR BRAIN WO CONTRAST  Result Date: 12/01/2021 CLINICAL DATA:  81 year old female with dizziness, nausea, hypertensive. TIA. EXAM: MRI HEAD WITHOUT CONTRAST TECHNIQUE: Multiplanar, multiecho pulse sequences of the brain and surrounding structures were obtained without intravenous contrast. COMPARISON:  CTA head and neck yesterday. FINDINGS: Brain: Cerebral volume is within normal limits for age. No restricted diffusion to suggest acute infarction. No midline shift, mass effect, evidence of mass lesion, ventriculomegaly, extra-axial collection or acute intracranial hemorrhage. Cervicomedullary junction and pituitary are within normal limits. Patchy and scattered bilateral cerebral white matter T2 and FLAIR hyperintensity, up to moderate for age. No cortical encephalomalacia or chronic cerebral blood products. Deep gray matter nuclei and brainstem appear normal. 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 Vascular: Major intracranial vascular flow voids are preserved. Skull and upper cervical spine: Normal for age visible cervical spine. Visualized bone marrow signal is within normal limits. Sinuses/Orbits: Postoperative changes to both globes. Scattered ethmoid and maxillary sinus mucosal thickening. Other: Mastoids are clear. Visible internal auditory structures appear normal. Normal stylomastoid foramina, visible scalp and face soft tissues. IMPRESSION: 1. No acute intracranial abnormality. 2. 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 And up to moderate for age signal changes in the cerebral white matter compatible with chronic small vessel disease.  Electronically Signed   By: Odessa Fleming M.D.   On: 12/01/2021 09:15   CT ANGIO HEAD NECK W WO CM  Result Date: 11/30/2021 CLINICAL DATA:  81 year old female with dizziness. Nausea. Hypertensive. EXAM: CT ANGIOGRAPHY HEAD AND NECK TECHNIQUE: Multidetector CT imaging of the head and neck was performed using the standard protocol during bolus administration of intravenous contrast. Multiplanar CT image reconstructions and MIPs were obtained to evaluate the vascular anatomy. Carotid stenosis measurements (when applicable) are obtained utilizing NASCET criteria, using the distal internal carotid diameter as the denominator. RADIATION DOSE REDUCTION: This exam was performed according to the departmental dose-optimization program which includes automated exposure control, adjustment of the mA and/or kV according to patient size and/or use of iterative reconstruction technique. CONTRAST:  32mL OMNIPAQUE IOHEXOL 350 MG/ML SOLN COMPARISON:  Head CT 06/27/2019. FINDINGS: CT HEAD Brain: Cerebral volume remains normal for age. No midline shift, ventriculomegaly, mass effect, evidence of mass lesion, intracranial hemorrhage or evidence of cortically based acute infarction. Gray-white matter differentiation appears stable and within normal limits for age. Calvarium and skull base: No acute osseous abnormality identified. Paranasal sinuses: Scattered paranasal sinus mucosal and mucoperiosteal thickening not significantly changed from 2021. Tympanic cavities and mastoids appear clear. Orbits: No acute orbit or scalp soft tissue finding. CTA NECK Skeleton: No acute osseous abnormality identified. Upper chest: Mild apical scarring and atelectasis. Other neck: No acute neck soft tissue finding. Aortic arch: 3 vessel arch configuration. Minimal arch atherosclerosis. Right carotid system: Mildly tortuous brachiocephalic artery and moderately tortuous right CCA origin with a kinked appearance but no significant plaque. Negative right  carotid bifurcation. No stenosis to the skull base. Left carotid system: Mild tortuosity with no left CCA plaque or stenosis. Mild calcified plaque at the medial left ICA origin. No significant stenosis to the skull base. Vertebral arteries: Minor plaque in the proximal right subclavian artery without stenosis. Normal right vertebral artery origin. Dominant appearing right vertebral artery is patent to the skull base with no plaque or stenosis. Proximal left subclavian artery and non dominant left vertebral artery origin are within normal limits. Patent but small left vertebral to the skull base with no stenosis identified. CTA HEAD Posterior circulation: Dominant right vertebral V4 segment is normal to the vertebrobasilar junction. Normal right PICA origin. Diminutive left V4 segment especially beyond the patent left PICA origin, but remains patent to the vertebrobasilar junction. Patent basilar artery without stenosis. Fetal type bilateral PCA origins. Patent SCA origins. Bilateral PCA branches are within normal limits. Anterior circulation: Both ICA siphons are patent. Mild bilateral siphon calcified plaque. No siphon stenosis. Normal posterior communicating artery origins. Patent carotid termini, MCA and ACA origins. Anterior communicating arteries within normal limits. Bilateral ACA branches are within normal limits, the left ACA A2 is dominant. Left MCA M1 segment and bifurcation are patent without stenosis. Right MCA M1 segment and trifurcation are patent without stenosis. Bilateral MCA branches are within normal limits. Venous sinuses: Patent. Anatomic variants: Dominant right vertebral artery, fetal type bilateral PCA origins with diminutive basilar system. Review of the MIP images confirms the above findings IMPRESSION: 1. Negative for large vessel occlusion. 2. Arterial tortuosity but mild for age atherosclerosis in the head and neck. No significant arterial stenosis. 3. Stable since 2021 and normal for  age CT appearance of the brain. 4. Chronic paranasal sinus disease. Electronically Signed   By: Odessa Fleming M.D.   On: 11/30/2021 12:39    Microbiology: Results for orders placed or performed during the hospital encounter of 11/30/21  Resp Panel by RT-PCR (Flu A&B, Covid) Anterior Nasal Swab     Status: None   Collection Time: 11/30/21 10:17 AM   Specimen: Anterior Nasal Swab  Result Value Ref Range Status   SARS Coronavirus 2 by RT PCR NEGATIVE NEGATIVE Final    Comment: (NOTE) SARS-CoV-2 target nucleic acids are NOT DETECTED.  The SARS-CoV-2 RNA is generally detectable in upper respiratory specimens during the acute phase of infection. The lowest concentration of SARS-CoV-2 viral copies this assay can detect is 138 copies/mL. A negative result does not preclude SARS-Cov-2 infection and should not be used as the sole basis for treatment or other patient management decisions. A negative result may occur with  improper specimen collection/handling, submission of specimen other than nasopharyngeal swab, presence of viral mutation(s) within  the areas targeted by this assay, and inadequate number of viral copies(<138 copies/mL). A negative result must be combined with clinical observations, patient history, and epidemiological information. The expected result is Negative.  Fact Sheet for Patients:  BloggerCourse.com  Fact Sheet for Healthcare Providers:  SeriousBroker.it  This test is no t yet approved or cleared by the [place] FDA and  has been authorized for detection and/or diagnosis of SARS-CoV-2 by FDA under an Emergency Use Authorization (EUA). This EUA will remain  in effect (meaning this test can be used) for the duration of the COVID-19 declaration under Section 564(b)(1) of the Act, 21 U.S.C.section 360bbb-3(b)(1), unless the authorization is terminated  or revoked sooner.       Influenza A by PCR NEGATIVE NEGATIVE  Final   Influenza B by PCR NEGATIVE NEGATIVE Final    Comment: (NOTE) The Xpert Xpress SARS-CoV-2/FLU/RSV plus assay is intended as an aid in the diagnosis of influenza from Nasopharyngeal swab specimens and should not be used as a sole basis for treatment. Nasal washings and aspirates are unacceptable for Xpert Xpress SARS-CoV-2/FLU/RSV testing.  Fact Sheet for Patients: BloggerCourse.com  Fact Sheet for Healthcare Providers: SeriousBroker.it  This test is not yet approved or cleared by the [place] FDA and has been authorized for detection and/or diagnosis of SARS-CoV-2 by FDA under an Emergency Use Authorization (EUA). This EUA will remain in effect (meaning this test can be used) for the duration of the COVID-19 declaration under Section 564(b)(1) of the Act, 21 U.S.C. section 360bbb-3(b)(1), unless the authorization is terminated or revoked.  Performed at [facility], [address], [place], [place] [address]     Labs: CBC: Recent Labs  Lab 11/30/21 0948  WBC 5.1  NEUTROABS 3.1  HGB 13.0  HCT 38.9  MCV 98.5  PLT 161   Basic Metabolic Panel: Recent Labs  Lab 11/30/21 0948  NA 137  Rice 3.8  CL 105  CO2 25  GLUCOSE 106*  BUN 21  CREATININE 0.79  CALCIUM 9.0   Liver Function Tests: Recent Labs  Lab 11/30/21 0948  AST 26  ALT 16  ALKPHOS 64  BILITOT 0.7  PROT 7.8  ALBUMIN 4.2   CBG: No results for input(s): "GLUCAP" in the last 168 hours.  Discharge time spent: greater than 30 minutes.  Signed: Joycelyn Das, MD [facility] 12/01/2021

## 2021-12-01 NOTE — Progress Notes (Signed)
Pt wheeled off unit by this RN. All pt's belongings brought down by her daughter.

## 2021-12-01 NOTE — Progress Notes (Addendum)
STROKE TEAM PROGRESS NOTE   INTERVAL HISTORY Her daughter is at the bedside.  She is sitting up on the side of the bed eating breakfast. She states she woke up "drunk feeling" and dizzy and imbalanced took BP and 187/105 also had Left side facial sensation problems, Denies slurred speech or vision problems.  denies numbness or tingling anywhere else.  Will start ASA/plavix for 3 weeks then ASA alone  MRI scan of the brain is negative for acute infarct.  CT angiogram of the brain and neck does not show large vessel stenosis or occlusion.  Urine drug screen is negative.  Echocardiogram is pending Vitals:   11/30/21 2029 11/30/21 2137 12/01/21 0007 12/01/21 0400  BP: 111/62 125/70 106/71 102/61  Pulse: 73 90 83 64  Resp: 16 18 18 16   Temp: 98.3 F (36.8 C) 98.7 F (37.1 C) 97.7 F (36.5 C) 97.7 F (36.5 C)  TempSrc: Oral Oral Oral Oral  SpO2: 99% 99% 97% 98%  Weight:      Height:       CBC:  Recent Labs  Lab 11/30/21 0948  WBC 5.1  NEUTROABS 3.1  HGB 13.0  HCT 38.9  MCV 98.5  PLT 161   Basic Metabolic Panel:  Recent Labs  Lab 11/30/21 0948  NA 137  K 3.8  CL 105  CO2 25  GLUCOSE 106*  BUN 21  CREATININE 0.79  CALCIUM 9.0    Urine Drug Screen:  Recent Labs  Lab 11/30/21 1144  LABOPIA NONE DETECTED  COCAINSCRNUR NONE DETECTED  LABBENZ NONE DETECTED  AMPHETMU NONE DETECTED  THCU NONE DETECTED  LABBARB NONE DETECTED    Alcohol Level  Recent Labs  Lab 11/30/21 0948  ETH <10    IMAGING past 24 hours CT ANGIO HEAD NECK W WO CM  Result Date: 11/30/2021 CLINICAL DATA:  81 year old female with dizziness. Nausea. Hypertensive. EXAM: CT ANGIOGRAPHY HEAD AND NECK TECHNIQUE: Multidetector CT imaging of the head and neck was performed using the standard protocol during bolus administration of intravenous contrast. Multiplanar CT image reconstructions and MIPs were obtained to evaluate the vascular anatomy. Carotid stenosis measurements (when applicable) are obtained  utilizing NASCET criteria, using the distal internal carotid diameter as the denominator. RADIATION DOSE REDUCTION: This exam was performed according to the departmental dose-optimization program which includes automated exposure control, adjustment of the mA and/or kV according to patient size and/or use of iterative reconstruction technique. CONTRAST:  28mL OMNIPAQUE IOHEXOL 350 MG/ML SOLN COMPARISON:  Head CT 06/27/2019. FINDINGS: CT HEAD Brain: Cerebral volume remains normal for age. No midline shift, ventriculomegaly, mass effect, evidence of mass lesion, intracranial hemorrhage or evidence of cortically based acute infarction. Gray-white matter differentiation appears stable and within normal limits for age. Calvarium and skull base: No acute osseous abnormality identified. Paranasal sinuses: Scattered paranasal sinus mucosal and mucoperiosteal thickening not significantly changed from 2021. Tympanic cavities and mastoids appear clear. Orbits: No acute orbit or scalp soft tissue finding. CTA NECK Skeleton: No acute osseous abnormality identified. Upper chest: Mild apical scarring and atelectasis. Other neck: No acute neck soft tissue finding. Aortic arch: 3 vessel arch configuration. Minimal arch atherosclerosis. Right carotid system: Mildly tortuous brachiocephalic artery and moderately tortuous right CCA origin with a kinked appearance but no significant plaque. Negative right carotid bifurcation. No stenosis to the skull base. Left carotid system: Mild tortuosity with no left CCA plaque or stenosis. Mild calcified plaque at the medial left ICA origin. No significant stenosis to the skull base. Vertebral  arteries: Minor plaque in the proximal right subclavian artery without stenosis. Normal right vertebral artery origin. Dominant appearing right vertebral artery is patent to the skull base with no plaque or stenosis. Proximal left subclavian artery and non dominant left vertebral artery origin are within  normal limits. Patent but small left vertebral to the skull base with no stenosis identified. CTA HEAD Posterior circulation: Dominant right vertebral V4 segment is normal to the vertebrobasilar junction. Normal right PICA origin. Diminutive left V4 segment especially beyond the patent left PICA origin, but remains patent to the vertebrobasilar junction. Patent basilar artery without stenosis. Fetal type bilateral PCA origins. Patent SCA origins. Bilateral PCA branches are within normal limits. Anterior circulation: Both ICA siphons are patent. Mild bilateral siphon calcified plaque. No siphon stenosis. Normal posterior communicating artery origins. Patent carotid termini, MCA and ACA origins. Anterior communicating arteries within normal limits. Bilateral ACA branches are within normal limits, the left ACA A2 is dominant. Left MCA M1 segment and bifurcation are patent without stenosis. Right MCA M1 segment and trifurcation are patent without stenosis. Bilateral MCA branches are within normal limits. Venous sinuses: Patent. Anatomic variants: Dominant right vertebral artery, fetal type bilateral PCA origins with diminutive basilar system. Review of the MIP images confirms the above findings IMPRESSION: 1. Negative for large vessel occlusion. 2. Arterial tortuosity but mild for age atherosclerosis in the head and neck. No significant arterial stenosis. 3. Stable since 2021 and normal for age CT appearance of the brain. 4. Chronic paranasal sinus disease. Electronically Signed   By: Odessa Fleming M.D.   On: 11/30/2021 12:39    PHYSICAL EXAM  Temp:  [97.7 F (36.5 C)-98.7 F (37.1 C)] 98 F (36.7 C) (08/13 1252) Pulse Rate:  [64-90] 69 (08/13 1252) Resp:  [16-20] 17 (08/13 1252) BP: (102-146)/(57-71) 112/57 (08/13 1252) SpO2:  [97 %-100 %] 98 % (08/13 1252)  General - Well nourished, well developed pleasant elderly Caucasian lady, in no apparent distress. Cardiovascular - Regular rhythm and rate.  Mental  Status -  Level of arousal and orientation to time, place, and person were intact. Language including expression, naming, repetition, comprehension was assessed and found intact. Attention span and concentration were normal. Recent and remote memory were intact. Fund of Knowledge was assessed and was intact.  Cranial Nerves II - XII - II - Visual field intact OU. III, IV, VI - Extraocular movements intact. V - Facial sensation intact bilaterally. VII - Facial movement intact bilaterally. VIII - Hearing & vestibular intact bilaterally. X - Palate elevates symmetrically. XI - Chin turning & shoulder shrug intact bilaterally. XII - Tongue protrusion intact.  Motor Strength - The patient's strength was normal in all extremities and pronator drift was absent.  Bulk was normal and fasciculations were absent.   Motor Tone - Muscle tone was assessed at the neck and appendages and was normal. Sensory - Light touch, temperature/pinprick were assessed and were symmetrical.   Coordination - The patient had normal movements in the hands and feet with no ataxia or dysmetria.  Tremor was absent. Gait and Station - deferred.   ASSESSMENT/PLAN Ms. Lauren Rice is a 81 y.o. female with history of hypertension, gastroesophageal reflux presented to the emergency room for evaluation of dizziness, numbness and gait difficulty. Last known well is somewhat difficult to ascertain due to below reasons: Started feeling somewhat unwell starting Thursday.  Initially blood pressures were low, got a new blood pressure cuff and then noted that blood pressures were high.  Went to bed in decent state of health-although not normal Friday, 11/29/2021 night at 9:30 PM and woke up the morning of 11/30/2021 with inability to walk properly going to the bathroom.  Felt dizzy and lightheaded as well as imbalanced while walking.  She felt that she was walking like a drunk.  She lives by herself so she did not talk so did not  know whether her speech was slurred.  Later on family was called who felt that she was not feeling normal, she felt that her head did not feel right.  She was also having a headache for the past 2 days.  .   Posterior circulation TIA: Likely from small vessel disease  CTA head & neck  1. Negative for large vessel occlusion. 2. Arterial tortuosity but mild for age atherosclerosis in the head and neck. No significant arterial stenosis. 3. Stable since 2021 and normal for age CT appearance of the brain. 4. Chronic paranasal sinus disease  MRI   1. No acute intracranial abnormality. 2. 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 And up to moderate for age signal changes in the cerebral white matter compatible with chronic small vessel disease.   2D Echo pending  LDL 124 HgbA1c 5.6 VTE prophylaxis - SCd's    Diet   Diet Heart Room service appropriate? Yes; Fluid consistency: Thin   No antiplatelets prior to admission, now on aspirin 81 mg daily and clopidogrel 75 mg daily. For 3 weeks then ASA alone  Therapy recommendations: Outpatient PT disposition: Home Hypertension Home meds:  lisinopril Stable Permissive hypertension (OK if < 220/120) but gradually normalize in 5-7 days Long-term BP goal normotensive  Hyperlipidemia Home meds:  none LDL 124., goal < 70 Add atorvastatin 80 mg   Continue statin at discharge  Other Stroke Risk Factors Advanced Age >/= 66   Other Active Problems GERD  Hospital day # 0  Gevena Mart DNP, ACNPC-AG    I have personally obtained history,examined this patient, reviewed notes, independently viewed imaging studies, participated in medical decision making and plan of care.ROS completed by me personally and pertinent positives fully documented  I have made any additions or clarifications directly to the above note. Agree with note above.  Patient presented with episode of dizziness and imbalance and drunken gait which lasted several hours and  CT scan of the head followed by MRI scan of the brain and CT angiogram of brain and neck were all unremarkable.  Suspect posterior circulation TIA.  Recommend check echocardiogram.  Aspirin and Plavix for 3 weeks followed by aspirin alone and aggressive risk factor modification.  Physical Occupational Therapy consults and likely discharge home later today.  Follow-up as an outpatient stroke clinic in 2 months.  Discussed with patient, daughter and Dr.Pokhrel.  Greater than 50% time during this 50-minute visit was spent in counseling and coordination of care about her episode of dizziness and gait imbalance and discussion about evaluation and treatment and prevention of TIA and stroke and discussion with care team and answering questions.  Delia Heady, MD Medical Director [facility] Pager: [phone] 12/01/2021 1:52 PM  To contact Stroke Continuity provider, please refer to [email or web address]. After hours, contact General Neurology

## 2021-12-01 NOTE — Progress Notes (Signed)
  Echocardiogram 2D Echocardiogram has been performed.  Delcie Roch 12/01/2021, 9:56 AM

## 2021-12-11 ENCOUNTER — Ambulatory Visit: Payer: Medicare Other

## 2021-12-11 ENCOUNTER — Ambulatory Visit: Payer: Medicare Other | Attending: Internal Medicine | Admitting: Occupational Therapy

## 2021-12-11 DIAGNOSIS — M6281 Muscle weakness (generalized): Secondary | ICD-10-CM | POA: Insufficient documentation

## 2021-12-11 DIAGNOSIS — R2681 Unsteadiness on feet: Secondary | ICD-10-CM | POA: Insufficient documentation

## 2021-12-11 DIAGNOSIS — R2689 Other abnormalities of gait and mobility: Secondary | ICD-10-CM | POA: Insufficient documentation

## 2021-12-11 NOTE — Therapy (Signed)
OUTPATIENT OCCUPATIONAL THERAPY NEURO EVALUATION  Patient Name: Alantis Bethune MRN: [id number] DOB:1941-02-02, 81 y.o., female Today's Date: 12/11/2021  PCP: Sherrilee Gilles, DO REFERRING PROVIDER: Flora Lipps, MD    OT End of Session - 12/11/21 1043     Visit Number 1    Number of Visits 1    Authorization Type [employer]    OT Start Time [phone]    OT Stop Time 1003    OT Time Calculation (min) 30 min    Activity Tolerance Patient tolerated treatment well    Behavior During Therapy WFL for tasks assessed/performed             Past Medical History:  Diagnosis Date   Allergic reaction to alpha-gal    cannot eat beef or pork   GERD (gastroesophageal reflux disease)    Hypertension    IBS (irritable bowel syndrome)    Microscopic colitis    Past Surgical History:  Procedure Laterality Date   APPENDECTOMY     OVARIAN CYST REMOVAL     Patient Active Problem List   Diagnosis Date Noted   Essential hypertension 11/30/2021   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  Pt reports having to hold onto furniture when ambulating to bathroom.  Pt took BP and noted it was high and noted bad headache and feeling nauseous.  Pt called grandson and he transferred her to [facility].  CT showed small infarcts previously, MRI (-).  Pt has been staying with daughter since then and wants to return home, alone. 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 Medical history significant of hypertension,  microscopic colitis   PRECAUTIONS: Fall  WEIGHT BEARING RESTRICTIONS No  PAIN:  Are you having pain? No  FALLS: Has patient fallen in last 6 months? No  LIVING ENVIRONMENT: Lives with: lives alone Lives in: Mobile home Stairs: Yes: External: 5-6 steps; can reach both Has following equipment at home: Grab bars  PLOF: Independent, Independent with basic ADLs, Independent with household mobility without device, Independent with community mobility without device, and reports having family that live ~5 miles from her and can assist/check in as needed  PATIENT GOALS to return home  OBJECTIVE:   HAND DOMINANCE: Right  ADLs: Overall ADLs: Independent with all ADLs, uses grab bar when stepping in/out of shower   IADLs: Overall IADLs: Pt has been helping out some at her daughter's home since d/c from hospital but has not completed full meal prep or paying bills.  Pt reports no concerns with returning to these tasks upon return to home. Medication management: Independent  MOBILITY STATUS: Independent  POSTURE COMMENTS:  No Significant postural limitations  ACTIVITY TOLERANCE: Activity tolerance: WFL for tasks assessed at eval  FUNCTIONAL OUTCOME MEASURES: [place] IADL scale: 8/8 Ron Parker Index of Independence in ADLs: 6/6  UPPER EXTREMITY ROM     Active ROM Right eval Left eval  Shoulder flexion [facility]  Shoulder abduction [facility]  Shoulder adduction [facility]  Shoulder extension    Shoulder internal rotation [facility]  Shoulder external rotation  [facility] WFL  Elbow flexion WFL WFL  Elbow extension [facility] WFL  Wrist flexion    Wrist extension    Wrist ulnar deviation    Wrist radial deviation    Wrist pronation    Wrist supination    (Blank rows = not tested)   UPPER EXTREMITY MMT:     MMT Right eval Left eval  Shoulder flexion 5/5 5/5  Shoulder abduction    Shoulder adduction    Shoulder extension    Shoulder internal rotation    Shoulder external rotation     Middle trapezius    Lower trapezius    Elbow flexion 5/5 5/5  Elbow extension 5/5 5/5  Wrist flexion    Wrist extension    Wrist ulnar deviation    Wrist radial deviation    Wrist pronation    Wrist supination    (Blank rows = not tested)  HAND FUNCTION: Grip strength: Right: 30 lbs; Left: 29 lbs  COORDINATION: Finger Nose Finger test: [facility] Hole Peg test: Right: 27.78 sec; Left: 27.06 sec  SENSATION: WFL  COGNITION: Overall cognitive status: Within functional limits for tasks assessed  VISION: Subjective report: no changes Baseline vision: Wears glasses for reading only Visual history:  cataract surgery   TODAY'S TREATMENT:  Educated on stroke signs/symptoms BeFast and energy conservation strategies during ADLs and IADLs.  Pt reports understanding and reports utilizing some of these strategies already.   PATIENT EDUCATION: Education details: Educated on role and purpose of OT as well as energy conservation strategies for ADLs/IADLs, and stroke signs/symptoms BE FAST Person educated: Patient Education method: Theatre stage manager Education comprehension: verbalized understanding   HOME EXERCISE PROGRAM: N/A    GOALS: Goals reviewed with patient? Yes  SHORT TERM GOALS: Target date: 12/11/21  Pt will report understanding of education provided. Baseline: Goal status: MET  ASSESSMENT:  CLINICAL IMPRESSION: Patient is a 81 y.o. female who was seen today for occupational therapy evaluation for impairments s/p TIA.  Pt arrives and reports back to baseline with ADLs and IADLs and plans to return home where she lives alone on Friday, she has been staying locally with her daughter. Pt scoring independent on Ron Parker and [place] and verbalizes understanding of energy conservation strategies and stroke signs/symptoms BE FAST.  Pt will not require any further OT intervention at this time.  PERFORMANCE DEFICITS in functional skills including endurance, decreased  knowledge of precautions, and decreased knowledge of use of DME.  IMPAIRMENTS are limiting patient from  N/A .   COMORBIDITIES may have co-morbidities  that affects occupational performance. Patient will benefit from skilled OT to address above impairments and improve overall function.  MODIFICATION OR ASSISTANCE TO COMPLETE EVALUATION: No modification of tasks or assist necessary to complete an evaluation.  OT OCCUPATIONAL PROFILE AND HISTORY: Problem focused assessment: Including review of records relating to presenting problem.  CLINICAL DECISION MAKING: LOW - limited treatment options, no task modification necessary  REHAB POTENTIAL: Excellent  EVALUATION COMPLEXITY: Low    PLAN: OT FREQUENCY: one time visit  OT DURATION: other: 1 time visit  PLANNED INTERVENTIONS: self care/ADL training, therapeutic activity, patient/family education, and energy conservation  RECOMMENDED OTHER SERVICES: N/A  CONSULTED AND AGREED WITH PLAN OF CARE: Patient  PLAN FOR NEXT SESSION: Dillon Bjork, OTR/L 12/11/2021, 10:44 AM

## 2021-12-11 NOTE — Therapy (Signed)
OUTPATIENT PHYSICAL THERAPY NEURO EVALUATION   Patient Name: Lauren Rice MRN: [id number] DOB:04-15-1941, 81 y.o., female Today's Date: 12/11/2021   PCP: Sherrilee Gilles REFERRING PROVIDER: Flora Lipps, MD/ Sherrilee Gilles   PT End of Session - 12/11/21 [phone]     Visit Number 1    Number of Visits 1    Authorization Type Medicare    PT Start Time 0845    PT Stop Time 0930    PT Time Calculation (min) 45 min    Activity Tolerance Patient tolerated treatment well    Behavior During Therapy WFL for tasks assessed/performed             Past Medical History:  Diagnosis Date   Allergic reaction to alpha-gal    cannot eat beef or pork   GERD (gastroesophageal reflux disease)    Hypertension    IBS (irritable bowel syndrome)    Microscopic colitis    Past Surgical History:  Procedure Laterality Date   APPENDECTOMY     OVARIAN CYST REMOVAL     Patient Active Problem List   Diagnosis Date Noted   Essential hypertension 11/30/2021   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   Gastroesophageal reflux disease 02/26/2021    ONSET DATE: 11/30/21  REFERRING DIAG: G45.9 (ICD-10-CM) - TIA (transient ischemic attack)   THERAPY DIAG:  Unsteadiness on feet  Other abnormalities of gait and mobility  Rationale for Evaluation and Treatment Rehabilitation  SUBJECTIVE:                                                                                                                                                                                              SUBJECTIVE STATEMENT: Sudden onset of dizziness upon awakening and measured BP which revealed systolic 270 mmHg and severe HA. Went to hospital and sent for CT scan and MRI and reveals likely TIA. Currently notes that she feels weak and "head feels not right". Pt notes that week after this event continued to feel dizzy and nauseated, but this has more or less resolved.   Pt accompanied by: self  PERTINENT  HISTORY: Lauren Rice is a 81 y.o. 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  Denied visual changes.  Denies focal weakness.  In the ED patient was hemodynamically stable.  CT head showed no acute intracranial abnormality.  Patient was then admitted hospital for further evaluation and treatment.    PAIN:  Are you having pain? No  PRECAUTIONS: None  WEIGHT BEARING RESTRICTIONS No  FALLS: Has patient fallen  in last 6 months? No  LIVING ENVIRONMENT: Lives with: lives alone, family lives nearby Lives in: Mobile home,  Stairs: Yes: External: 5 steps; can reach both Has following equipment at home: None  PLOF: Independent  PATIENT GOALS return home this week  OBJECTIVE:   DIAGNOSTIC FINDINGS: IMPRESSION: 1. No acute intracranial abnormality. 2. 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 And up to moderate for age signal changes in the cerebral white matter compatible with chronic small vessel disease.  COGNITION: Overall cognitive status: Within functional limits for tasks assessed   SENSATION: WFL  COORDINATION: WNL    MUSCLE TONE: normal     POSTURE: No Significant postural limitations  LOWER EXTREMITY ROM:    Normal  LOWER EXTREMITY MMT:    5/5 gross BLE strength  BED MOBILITY:  Independent  TRANSFERS: Assistive device utilized: None  Sit to stand: Complete Independence Stand to sit: Complete Independence Chair to chair: Complete Independence Floor: Modified independence  RAMP:  Level of Assistance: Complete Independence Assistive device utilized: None Ramp Comments:   CURB:  Level of Assistance: Complete Independence Assistive device utilized: None Curb Comments:   STAIRS:  Level of Assistance: Complete Independence  Stair Negotiation Technique: Alternating Pattern  with Bilateral Rails  Number of Stairs: 12   Height of  Stairs: 4-6"  Comments:   GAIT: Gait pattern: WFL Distance walked:  Assistive device utilized: None Level of assistance: Complete Independence Comments:   FUNCTIONAL TESTs:  5 times sit to stand: 11 seconds Timed up and go (TUG):   10 meter walk test: 9.75 sec ; 3.36 ft/sec Berg Balance Scale: 52/56  M-CTSIB  Condition 1: Firm Surface, EO 30 Sec, Normal Sway  Condition 2: Firm Surface, EC 30 Sec, Mild Sway  Condition 3: Foam Surface, EO 20 Sec, Mild and Moderate Sway  Condition 4: Foam Surface, EC 9 Sec, Severe Sway    PATIENT SURVEYS:  FOTO    TODAY'S TREATMENT:     PATIENT EDUCATION: Education details: assessment findings Person educated: Patient Education method: Explanation Education comprehension: verbalized understanding   HOME EXERCISE PROGRAM: Corner balance: EO/EC feet together   Semi-tandem EO/EC    GOALS: Goals reviewed with patient? Yes  SHORT TERM GOALS: Target date:  12/11/21  Return demonstration of balance exercises for HEP Baseline: initiated, return demonstration using printed materials Goal status: MET    ASSESSMENT:  CLINICAL IMPRESSION: Patient is a 81 y.o. lady who was seen today for physical therapy evaluation and treatment for supposed deficits/limitations following TIA. Thankfully, pt does not exhibit any deficits or impairments affecting mobility and ADL and manifests low risk for falls per fall risk assessments and demonstrates independence in mobility and normalized gait pattern.  No residual weakness or sensory disturbance noted.  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  Pt provided with activities to address her balance deficits with narrow BOS and eyes closed condition and would  prefer to practice at home vs formal PT POC.  No further PT services indicated at this time   OBJECTIVE IMPAIRMENTS decreased balance and vestibular dysfunction .   ACTIVITY LIMITATIONS  none  PARTICIPATION LIMITATIONS:  none  PERSONAL FACTORS 1 comorbidity: HTN  are also affecting patient's functional outcome.   REHAB POTENTIAL: N/A at this time  CLINICAL DECISION MAKING: Stable/uncomplicated  EVALUATION COMPLEXITY: Low  PLAN: PT FREQUENCY: one time visit  PT DURATION: other: one time visit  PLANNED INTERVENTIONS: Neuromuscular re-education and Balance training  PLAN FOR NEXT SESSION: N/A   10:06 AM, 12/11/21 M. Sherlyn Lees, PT, DPT Physical Therapist- [place] Office Number: [phone]

## 2022-01-07 NOTE — Progress Notes (Unsigned)
[facility] [address] [place]. [place] [address] [phone]       HOSPITAL FOLLOW UP NOTE  Ms. Lauren Rice Date of Birth:  [birth year] Medical Record Number:  [id number]   Reason for Referral:  hospital stroke follow up   SUBJECTIVE:   CHIEF COMPLAINT:  No chief complaint on file.   HPI:   Lauren Rice is a 81 y.o. who  has a past medical history of Allergic reaction to alpha-gal, GERD (gastroesophageal reflux disease), Hypertension, IBS (irritable bowel syndrome), and Microscopic colitis.  Patient presented on 11/30/2021 with dizziness and gait difficulty. She reported feeling unwell the day prior and had experienced a headache for two days. CT was unremarkable. 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 She was started on Plavix and asa 81mg  daily for three weeks then asa alone. She was also started on atorvastatin 80mg  daily. PT/OT eval performed with no additional therapy recommended. Personally reviewed hospitalization pertinent progress notes, lab work and imaging.  Evaluated by Dr .   Since discharge, she reports doing well. She stayed with her daughter for two weeks but has returned to her home. She lives alone. Able to perform ADLs, manage home, finances and medications. She drives without difficulty. She denies any new stroke like symptoms. No deficits. PT/OT cleared her. PCP checked blood work yesterday. LDL was 52. A1C was not rechecked.   PERTINENT IMAGING/LABS  CTA head & neck  1. Negative for large vessel occlusion. 2. Arterial tortuosity but mild for age atherosclerosis in the head and neck. No significant arterial stenosis. 3. Stable since 2021 and normal for age CT appearance of the brain. 4. Chronic paranasal sinus disease   MRI   1. No acute intracranial abnormality. 2. 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 And up to moderate for age signal changes in the  cerebral white matter compatible with chronic small vessel disease.   Pearlean Brownie ECHO EF 60-65%   A1C Lab Results  Component Value Date   HGBA1C 5.6 12/01/2021    Lipid Panel     Component Value Date/Time   CHOL 201 (H) 12/01/2021 0154   CHOL 201 (H) 12/01/2021 0154   TRIG 205 (H) 12/01/2021 0154   TRIG 221 (H) 12/01/2021 0154   HDL 36 (L) 12/01/2021 0154   HDL 36 (L) 12/01/2021 0154   CHOLHDL 5.6 12/01/2021 0154   CHOLHDL 5.6 12/01/2021 0154   VLDL 41 (H) 12/01/2021 0154   VLDL 44 (H) 12/01/2021 0154   LDLCALC 124 (H) 12/01/2021 0154   LDLCALC 121 (H) 12/01/2021 0154      ROS:   14 system review of systems performed and negative with exception of those listed in HPI  PMH:  Past Medical History:  Diagnosis Date   Allergic reaction to alpha-gal    cannot eat beef or pork   GERD (gastroesophageal reflux disease)    Hypertension    IBS (irritable bowel syndrome)    Microscopic colitis     PSH:  Past Surgical History:  Procedure Laterality Date   APPENDECTOMY     OVARIAN CYST REMOVAL      Social History:  Social History   Socioeconomic History   Marital status: Widowed    Spouse name: Not on file   Number of children: Not on file   Years of education: Not on file   Highest education level: Not on file  Occupational History   Not on file  Tobacco Use   Smoking status: Never   Smokeless tobacco: Never  Vaping Use   Vaping Use: Never used  Substance and Sexual Activity   Alcohol use: Never   Drug use: Never   Sexual activity: Not on file  Other Topics Concern   Not on file  Social History Narrative   Not on file   Social Determinants of Health   Financial Resource Strain: Not on file  Food Insecurity: Not on file  Transportation Needs: Not on file  Physical Activity: Not on file  Stress: Not on file  Social Connections: Not on file  Intimate Partner Violence: Not on file    Family History:  Family History  Problem Relation Age of Onset   Heart  disease Mother    Emphysema Father    Crohn's disease Sister     Medications:   Current Outpatient Medications on File Prior to Visit  Medication Sig Dispense Refill   acetaminophen (TYLENOL) 325 MG tablet Take 650 mg by mouth every 6 (six) hours as needed.     albuterol (PROAIR HFA) 108 (90 Base) MCG/ACT inhaler Inhale 1 puff into the lungs every 6 (six) hours as needed for shortness of breath.     aspirin EC 81 MG tablet Take 1 tablet (81 mg total) by mouth daily. Swallow whole. 150 tablet 2   atorvastatin (LIPITOR) 40 MG tablet Take 1 tablet (40 mg total) by mouth at bedtime. 30 tablet 2   Biotin 1000 MCG tablet Take by mouth.     EPINEPHrine 0.3 mg/0.3 mL IJ SOAJ injection Inject 0.3 mg into the muscle as needed for anaphylaxis.     fluticasone (FLONASE) 50 MCG/ACT nasal spray Place 1 spray into both nostrils daily.     fluticasone (FLOVENT HFA) 44 MCG/ACT inhaler Inhale 1 puff into the lungs in the morning and at bedtime.     lisinopril (ZESTRIL) 20 MG tablet Take 20 mg by mouth daily.     loratadine (CLARITIN) 10 MG tablet Take 10 mg by mouth daily.     Multiple Vitamins-Minerals (CENTRUM ULTRA WOMENS PO) Take by mouth daily.     omeprazole (PRILOSEC) 20 MG capsule Take 20 mg by mouth every other day.     No current facility-administered medications on file prior to visit.    Allergies:   Allergies  Allergen Reactions   Beef-Derived Products     Pt has Alpha-gal and cannot eat beef or pork   Advil [Ibuprofen] Cough   Pork-Derived Products     Pt has Alpha-gal and cannot eat beef or pork      OBJECTIVE:  Physical Exam  There were no vitals filed for this visit. There is no height or weight on file to calculate BMI. No results found.      No data to display           General: well developed, well nourished, seated, in no evident distress Head: head normocephalic and atraumatic.   Neck: supple with no carotid or supraclavicular bruits Cardiovascular: regular  rate and rhythm, no murmurs Musculoskeletal: no deformity Skin:  no rash/petichiae Vascular:  Normal pulses all extremities   Neurologic Exam Mental Status: Awake and fully alert.  Fluent speech and language.  Oriented to place and time. Recent and remote memory intact. Attention span, concentration and fund of knowledge appropriate. Mood and affect appropriate.  Cranial Nerves: Fundoscopic exam reveals sharp disc margins. Pupils equal, briskly reactive to light. Extraocular movements full without nystagmus. Visual fields  full to confrontation. Hearing intact. Facial sensation intact. Face, tongue, palate moves normally and symmetrically.  Motor: Normal bulk and tone. Normal strength in all tested extremity muscles Sensory.: intact to touch , pinprick , position and vibratory sensation.  Coordination: Rapid alternating movements normal in all extremities. Finger-to-nose and heel-to-shin performed accurately bilaterally. Gait and Station: Arises from chair without difficulty. Stance is normal. Gait demonstrates normal stride length and balance with no assistive device.  Reflexes: 1+ and symmetric.    NIHSS  0 Modified Rankin  0    ASSESSMENT: Lauren Rice is a 81 y.o. year old female presenting to ER with dizziness and gait difficulty. Vascular risk factors include HTN, HLD, prediabetes, advanced age.     PLAN:  TIA: likely due to small vessel disease: Residual deficit: none. Continue aspirin 81 mg daily  and atorvastatin for secondary stroke prevention.  Discussed secondary stroke prevention measures and importance of close PCP follow up for aggressive stroke risk factor management. I have gone over the pathophysiology of stroke, warning signs and symptoms, risk factors and their management in some detail with instructions to go to the closest emergency room for symptoms of concern. HTN: BP goal <130/90.  Stable on lisinopril 20mg  daily per PCP HLD: LDL goal <70. Recent LDL 52.  Continue atorvastatin 40mg  daily per PCP.  DMII: A1c goal<7.0. Recent A1c 5.7. Please continue close follow up with PCP for prediabetes. Low carb diet and regular exercise recommended.    Follow up as needed   CC:  [facility] provider: Dr. Leonie Man PCP: Sherrilee Gilles, DO    I spent 45 minutes of face-to-face and non-face-to-face time with patient.  This included previsit chart review including review of recent hospitalization, lab review, study review, order entry, electronic health record documentation, patient education regarding recent stroke including etiology, secondary stroke prevention measures and importance of managing stroke risk factors, residual deficits and typical recovery time and answered all other questions to patient satisfaction   Debbora Presto, [facility]  [facility] [address] [place], [place] [address]  Phone [phone] Fax [phone] Note: This document was prepared with digital dictation and possible smart phrase technology. Any transcriptional errors that result from this process are unintentional.

## 2022-01-07 NOTE — Patient Instructions (Signed)
Below is our plan:  TIA: likely due to small vessel disease: Residual deficit: none. Continue aspirin 81 mg daily  and atorvastatin for secondary stroke prevention.  Discussed secondary stroke prevention measures and importance of close PCP follow up for aggressive stroke risk factor management. I have gone over the pathophysiology of stroke, warning signs and symptoms, risk factors and their management in some detail with instructions to go to the closest emergency room for symptoms of concern. HTN: BP goal <130/90.  Stable on lisinopril per PCP HLD: LDL goal <70. Recent LDL 124. Continue atorvastatin 80mg  daily per PCP.  DMII: A1c goal<7.0. Recent A1c 5.6. Please continue close follow up with PCP for prediabetes. Low carb diet and regular exercise recommended.   Please make sure you are staying well hydrated. I recommend 50-60 ounces daily. Well balanced diet and regular exercise encouraged. Consistent sleep schedule with 6-8 hours recommended.   Please continue follow up with care team as directed.   Follow up with *** in ***  You may receive a survey regarding today's visit. I encourage you to leave honest feed back as I do use this information to improve patient care. Thank you for seeing me today!

## 2022-01-08 ENCOUNTER — Ambulatory Visit: Payer: Medicare Other | Admitting: Family Medicine

## 2022-01-08 ENCOUNTER — Encounter: Payer: Self-pay | Admitting: Family Medicine

## 2022-01-08 VITALS — BP 115/62 | HR 61 | Ht 65.0 in | Wt 148.0 lb

## 2022-01-08 DIAGNOSIS — G459 Transient cerebral ischemic attack, unspecified: Secondary | ICD-10-CM

## 2022-01-11 NOTE — Progress Notes (Signed)
I agree with the above plan 

## 2022-03-01 ENCOUNTER — Encounter (INDEPENDENT_AMBULATORY_CARE_PROVIDER_SITE_OTHER): Payer: Self-pay | Admitting: Gastroenterology

## 2022-04-21 ENCOUNTER — Encounter (HOSPITAL_COMMUNITY): Payer: Self-pay | Admitting: Emergency Medicine

## 2022-04-21 ENCOUNTER — Emergency Department (HOSPITAL_COMMUNITY)
Admission: EM | Admit: 2022-04-21 | Discharge: 2022-04-22 | Disposition: A | Discharge status: Home / Self Care | Payer: Medicare Other | Attending: Emergency Medicine | Admitting: Emergency Medicine

## 2022-04-21 ENCOUNTER — Other Ambulatory Visit: Payer: Self-pay

## 2022-04-21 ENCOUNTER — Emergency Department (HOSPITAL_COMMUNITY): Payer: Medicare Other

## 2022-04-21 DIAGNOSIS — R930 Abnormal findings on diagnostic imaging of skull and head, not elsewhere classified: Secondary | ICD-10-CM | POA: Insufficient documentation

## 2022-04-21 DIAGNOSIS — Z1152 Encounter for screening for COVID-19: Secondary | ICD-10-CM | POA: Diagnosis not present

## 2022-04-21 DIAGNOSIS — R052 Subacute cough: Secondary | ICD-10-CM | POA: Insufficient documentation

## 2022-04-21 DIAGNOSIS — Z79899 Other long term (current) drug therapy: Secondary | ICD-10-CM | POA: Insufficient documentation

## 2022-04-21 DIAGNOSIS — Z7982 Long term (current) use of aspirin: Secondary | ICD-10-CM | POA: Insufficient documentation

## 2022-04-21 DIAGNOSIS — Z8673 Personal history of transient ischemic attack (TIA), and cerebral infarction without residual deficits: Secondary | ICD-10-CM | POA: Insufficient documentation

## 2022-04-21 DIAGNOSIS — R202 Paresthesia of skin: Secondary | ICD-10-CM | POA: Diagnosis not present

## 2022-04-21 DIAGNOSIS — M4854XA Collapsed vertebra, not elsewhere classified, thoracic region, initial encounter for fracture: Secondary | ICD-10-CM | POA: Insufficient documentation

## 2022-04-21 LAB — I-STAT CHEM 8, ED
BUN: 11 mg/dL (ref 8–23)
Calcium, Ion: 1.16 mmol/L (ref 1.15–1.40)
Chloride: 102 mmol/L (ref 98–111)
Creatinine, Ser: 0.6 mg/dL (ref 0.44–1.00)
Glucose, Bld: 111 mg/dL — ABNORMAL HIGH (ref 70–99)
HCT: 38 % (ref 36.0–46.0)
Hemoglobin: 12.9 g/dL (ref 12.0–15.0)
Potassium: 4 mmol/L (ref 3.5–5.1)
Sodium: 142 mmol/L (ref 135–145)
TCO2: 28 mmol/L (ref 22–32)

## 2022-04-21 LAB — CBC
HCT: 38.1 % (ref 36.0–46.0)
Hemoglobin: 12.3 g/dL (ref 12.0–15.0)
MCH: 32.7 pg (ref 26.0–34.0)
MCHC: 32.3 g/dL (ref 30.0–36.0)
MCV: 101.3 fL — ABNORMAL HIGH (ref 80.0–100.0)
Platelets: 182 10*3/uL (ref 150–400)
RBC: 3.76 MIL/uL — ABNORMAL LOW (ref 3.87–5.11)
RDW: 13 % (ref 11.5–15.5)
WBC: 5.7 10*3/uL (ref 4.0–10.5)
nRBC: 0 % (ref 0.0–0.2)

## 2022-04-21 LAB — DIFFERENTIAL
Abs Immature Granulocytes: 0.01 10*3/uL (ref 0.00–0.07)
Basophils Absolute: 0.1 10*3/uL (ref 0.0–0.1)
Basophils Relative: 1 %
Eosinophils Absolute: 0.1 10*3/uL (ref 0.0–0.5)
Eosinophils Relative: 1 %
Immature Granulocytes: 0 %
Lymphocytes Relative: 33 %
Lymphs Abs: 1.9 10*3/uL (ref 0.7–4.0)
Monocytes Absolute: 0.5 10*3/uL (ref 0.1–1.0)
Monocytes Relative: 9 %
Neutro Abs: 3.2 10*3/uL (ref 1.7–7.7)
Neutrophils Relative %: 56 %

## 2022-04-21 LAB — COMPREHENSIVE METABOLIC PANEL
ALT: 29 U/L (ref 0–44)
AST: 29 U/L (ref 15–41)
Albumin: 4.2 g/dL (ref 3.5–5.0)
Alkaline Phosphatase: 67 U/L (ref 38–126)
Anion gap: 12 (ref 5–15)
BUN: 11 mg/dL (ref 8–23)
CO2: 25 mmol/L (ref 22–32)
Calcium: 9 mg/dL (ref 8.9–10.3)
Chloride: 102 mmol/L (ref 98–111)
Creatinine, Ser: 0.72 mg/dL (ref 0.44–1.00)
GFR, Estimated: >60 mL/min (ref >60)
Glucose, Bld: 114 mg/dL — ABNORMAL HIGH (ref 70–99)
Potassium: 3.9 mmol/L (ref 3.5–5.1)
Sodium: 139 mmol/L (ref 135–145)
Total Bilirubin: 0.5 mg/dL (ref 0.3–1.2)
Total Protein: 8.2 g/dL — ABNORMAL HIGH (ref 6.5–8.1)

## 2022-04-21 LAB — ETHANOL: Alcohol, Ethyl (B): <10 mg/dL (ref <10)

## 2022-04-21 LAB — APTT: aPTT: 34 seconds (ref 24–36)

## 2022-04-21 LAB — PROTIME-INR
INR: 1.2 (ref 0.8–1.2)
Prothrombin Time: 14.6 seconds (ref 11.4–15.2)

## 2022-04-21 NOTE — ED Provider Triage Note (Signed)
Emergency Medicine Provider Triage Evaluation Note  Lauren Rice , a 82 y.o. female  was evaluated in triage.  Pt complains of paresthesias in the left half of her face and neck as well as her left arm that started on 56 tonight and resolved sometime on her way to the hospital.  History of TIA in August of this year.  Took aspirin and Plavix x 3 weeks now just on a daily aspirin which she endorses compliance with.  Endorse hypertension at home during this episode.  Review of Systems  Positive: Left-sided paresthesias Negative: Weakness, slurred speech, confusion  Physical Exam  BP (!) 182/76   Pulse 70   Temp 98.3 F (36.8 C) (Oral)   Resp 16   Ht 5' 5.5" (1.664 m)   Wt 64.9 kg   SpO2 98%   BMI 23.43 kg/m  Gen:   Awake, no distress   Resp:  Normal effort  MSK:   Moves extremities without difficulty  Other:  Neuro exam nonfocal in triage. NO carotid bruit on exam bilaterally.   Medical Decision Making  Medically screening exam initiated at 10:28 PM.  Appropriate orders placed.  Lauren Rice was informed that the remainder of the evaluation will be completed by another provider, this initial triage assessment does not replace that evaluation, and the importance of remaining in the ED until their evaluation is complete.  Stroke workup initiated, no code stroke activation with NIH of 0. ? TIA.   This chart was dictated using voice recognition software, Dragon. Despite the best efforts of this provider to proofread and correct errors, errors may still occur which can change documentation meaning.    Emeline Darling, PA-C 04/21/22 2241

## 2022-04-21 NOTE — ED Triage Notes (Signed)
Per pt, she began to experiencing left sided facial tingling around 1730 tonight.  She has hx of same in August.  Negative NIH in triage.  Pt states it feels "different and swollen."  Pt was hypertensive at home.

## 2022-04-22 ENCOUNTER — Emergency Department (HOSPITAL_COMMUNITY): Payer: Medicare Other

## 2022-04-22 LAB — URINALYSIS, ROUTINE W REFLEX MICROSCOPIC
Bilirubin Urine: NEGATIVE
Glucose, UA: NEGATIVE mg/dL
Ketones, ur: 5 mg/dL — AB
Leukocytes,Ua: NEGATIVE
Nitrite: NEGATIVE
Protein, ur: NEGATIVE mg/dL
Specific Gravity, Urine: 1.009 (ref 1.005–1.030)
pH: 6 (ref 5.0–8.0)

## 2022-04-22 LAB — RAPID URINE DRUG SCREEN, HOSP PERFORMED
Amphetamines: NOT DETECTED
Barbiturates: NOT DETECTED
Benzodiazepines: NOT DETECTED
Cocaine: NOT DETECTED
Opiates: NOT DETECTED
Tetrahydrocannabinol: NOT DETECTED

## 2022-04-22 LAB — RESP PANEL BY RT-PCR (RSV, FLU A&B, COVID)  RVPGX2
Influenza A by PCR: NEGATIVE
Influenza B by PCR: NEGATIVE
Resp Syncytial Virus by PCR: NEGATIVE
SARS Coronavirus 2 by RT PCR: NEGATIVE

## 2022-04-22 MED ORDER — BENZONATATE 100 MG PO CAPS: 100.0000 mg | ORAL_CAPSULE | Freq: Three times a day (TID) | ORAL | 0 refills | Status: AC

## 2022-04-22 NOTE — ED Provider Notes (Addendum)
[facility] EMERGENCY DEPARTMENT Provider Note   CSN: [id number] Arrival date & time: 04/21/22  2150     History  No chief complaint on file.   Lauren Rice is a 82 y.o. female.  Patient is a 69 female who presents with possible TIA.  She states that yesterday she had some tingling in the left side of her face and her left arm.  She said the side of her neck felt a little funny but there is no pain there.  No headache.  No dizziness.  No weakness in the arm or the leg.  No vision changes.  No speech deficits.  On chart review she was seen in August for similar symptoms although it was associated with significant dizziness as well.  She had a stroke workup including MRI which was negative other than she had some old cerebellar infarcts.  She had an echocardiogram and a CTA of the head and neck which were unrevealing.  Her symptoms had resolved.  Yesterday she notes her blood pressure was elevated as well.  It seems to is improved.  She has had some intermittent tingling to the same areas throughout yesterday but none today.  The longest lasted was about 30 minutes.  She currently denies any symptoms.  However she has had a bit of a cough and some congestion for the last several days.  No shortness of breath.       Home Medications Prior to Admission medications   Medication Sig Start Date End Date Taking? Authorizing Provider  benzonatate (TESSALON) 100 MG capsule Take 1 capsule (100 mg total) by mouth every 8 (eight) hours. 04/22/22  Yes Malvin Johns, MD  acetaminophen (TYLENOL) 325 MG tablet Take 650 mg by mouth every 6 (six) hours as needed.    [provider]  albuterol (PROAIR HFA) 108 (90 Base) MCG/ACT inhaler Inhale 1 puff into the lungs every 6 (six) hours as needed for shortness of breath.    [provider]  aspirin EC 81 MG tablet Take 1 tablet (81 mg total) by mouth daily. Swallow whole. 12/01/21 12/01/22  Pokhrel, Corrie Mckusick, MD   atorvastatin (LIPITOR) 40 MG tablet Take 1 tablet (40 mg total) by mouth at bedtime. 12/01/21 03/01/22  Pokhrel, Corrie Mckusick, MD  Biotin 1000 MCG tablet Take by mouth. 06/21/21   [provider]  EPINEPHrine 0.3 mg/0.3 mL IJ SOAJ injection Inject 0.3 mg into the muscle as needed for anaphylaxis. 07/29/21   [provider]  fluticasone (FLONASE) 50 MCG/ACT nasal spray Place 1 spray into both nostrils daily.    [provider]  fluticasone (FLOVENT HFA) 44 MCG/ACT inhaler Inhale 1 puff into the lungs in the morning and at bedtime.    [provider]  lisinopril (ZESTRIL) 20 MG tablet Take 20 mg by mouth daily. 05/06/19   [provider]  loratadine (CLARITIN) 10 MG tablet Take 10 mg by mouth daily. 01/26/17   [provider]  Multiple Vitamins-Minerals (CENTRUM ULTRA WOMENS PO) Take by mouth daily.    [provider]  omeprazole (PRILOSEC) 20 MG capsule Take 20 mg by mouth every other day. 02/05/21   [provider]      Allergies    Beef-derived products, Advil [ibuprofen], and Pork-derived products    Review of Systems   Review of Systems  Constitutional:  Negative for chills, diaphoresis, fatigue and fever.  HENT:  Negative for congestion, rhinorrhea and sneezing.   Eyes: Negative.   Respiratory:  Positive for cough. Negative for chest tightness and shortness of breath.   Cardiovascular:  Negative for chest pain and leg swelling.  Gastrointestinal:  Negative for abdominal pain, blood in stool, diarrhea, nausea and vomiting.  Genitourinary:  Negative for difficulty urinating, flank pain, frequency and hematuria.  Musculoskeletal:  Negative for arthralgias and back pain.  Skin:  Negative for rash.  Neurological:  Positive for numbness. Negative for dizziness, speech difficulty, weakness and headaches.    Physical Exam Updated Vital Signs BP (!) 152/66   Pulse 86   Temp 98 F (36.7 C) (Oral)   Resp 16   Ht 5' 5.5" (1.664  m)   Wt 64.9 kg   SpO2 100%   BMI 23.43 kg/m  Physical Exam Constitutional:      Appearance: She is well-developed.  HENT:     Head: Normocephalic and atraumatic.  Eyes:     Pupils: Pupils are equal, round, and reactive to light.  Cardiovascular:     Rate and Rhythm: Normal rate and regular rhythm.     Heart sounds: Normal heart sounds.  Pulmonary:     Effort: Pulmonary effort is normal. No respiratory distress.     Breath sounds: Normal breath sounds. No wheezing or rales.  Chest:     Chest wall: No tenderness.  Abdominal:     General: Bowel sounds are normal.     Palpations: Abdomen is soft.     Tenderness: There is no abdominal tenderness. There is no guarding or rebound.  Musculoskeletal:        General: Normal range of motion.     Cervical back: Normal range of motion and neck supple.  Lymphadenopathy:     Cervical: No cervical adenopathy.  Skin:    General: Skin is warm and dry.     Findings: No rash.  Neurological:     Mental Status: She is alert and oriented to person, place, and time.     Comments: Motor 5/5 all extremities Sensation grossly intact to LT all extremities Finger to Nose intact, no pronator drift CN II-XII grossly intact       ED Results / Procedures / Treatments   Labs (all labs ordered are listed, but only abnormal results are displayed) Labs Reviewed  CBC - Abnormal; Notable for the following components:      Result Value   RBC 3.76 (*)    MCV 101.3 (*)    All other components within normal limits  COMPREHENSIVE METABOLIC PANEL - Abnormal; Notable for the following components:   Glucose, Bld 114 (*)    Total Protein 8.2 (*)    All other components within normal limits  URINALYSIS, ROUTINE W REFLEX MICROSCOPIC - Abnormal; Notable for the following components:   Hgb urine dipstick SMALL (*)    Ketones, ur 5 (*)    Bacteria, UA RARE (*)    All other components within normal limits  I-STAT CHEM 8, ED - Abnormal; Notable for the  following components:   Glucose, Bld 111 (*)    All other components within normal limits  RESP PANEL BY RT-PCR (RSV, FLU A&B, COVID)  RVPGX2  ETHANOL  PROTIME-INR  APTT  DIFFERENTIAL  RAPID URINE DRUG SCREEN, HOSP PERFORMED    EKG EKG Interpretation  Date/Time:  Tuesday April 22 2022 13:20:08 EST Ventricular Rate:  65 PR Interval:  130 QRS Duration: 94 QT Interval:  432 QTC Calculation: 449 R Axis:   80 Text Interpretation: Normal sinus rhythm Normal ECG When compared  with ECG of 30-Nov-2021 09:39, PREVIOUS ECG IS PRESENT since last tracing no significant change Confirmed by Rolan Bucco [phone]) on 04/22/2022 2:14:13 PM  Radiology DG Chest 2 View  Result Date: 04/22/2022 CLINICAL DATA:  Cough. EXAM: CHEST - 2 VIEW COMPARISON:  Portable chest 06/27/2019.  Chest CT 03/13/2014. FINDINGS: The heart size and mediastinal contours are stable. There is mild biapical scarring. Mild atelectasis or scarring is noted at the left lung base. There is no edema, confluent airspace opacity, pneumothorax or significant pleural effusion. A superior endplate compression deformity at T10 has developed since previous CT, without definite acute features. IMPRESSION: 1. No evidence of acute cardiopulmonary process. 2. Interval development of a superior endplate compression deformity at T10, of indeterminate age. Electronically Signed   By: Carey Bullocks M.D.   On: 04/22/2022 12:13   MR BRAIN WO CONTRAST  Result Date: 04/22/2022 CLINICAL DATA:  TIA EXAM: MRI HEAD WITHOUT CONTRAST TECHNIQUE: Multiplanar, multiecho pulse sequences of the brain and surrounding structures were obtained without intravenous contrast. COMPARISON:  MRI Head 12/01/21 FINDINGS: Brain: No acute infarction, hemorrhage, hydrocephalus, extra-axial collection or mass lesion. Sequela of mild chronic microvascular ischemic change. Chronic bilateral cerebellar small-vessel infarcts. Vascular: Normal flow voids. Skull and upper cervical spine:  Normal marrow signal. Sinuses/Orbits: Bilateral lens replacement. Mucosal thickening bilateral maxillary, frontal, and right ethmoid sphenoid sinuses. Other: None. IMPRESSION: 1. No acute intracranial process. 2. Chronic microvascular ischemic change and chronic bilateral cerebellar small-vessel infarcts. Electronically Signed   By: Lorenza Cambridge M.D.   On: 04/22/2022 10:45   CT HEAD WO CONTRAST  Result Date: 04/21/2022 CLINICAL DATA:  Transient ischemic attack. Per patient she began experiencing left-sided facial tingling around 17:30. EXAM: CT HEAD WITHOUT CONTRAST TECHNIQUE: Contiguous axial images were obtained from the base of the skull through the vertex without intravenous contrast. RADIATION DOSE REDUCTION: This exam was performed according to the departmental dose-optimization program which includes automated exposure control, adjustment of the mA and/or kV according to patient size and/or use of iterative reconstruction technique. COMPARISON:  CT examination dated November 30, 2021 and MR examination dated December 01, 2021. FINDINGS: Brain: No evidence of acute infarction, hemorrhage, hydrocephalus, extra-axial collection or mass lesion/mass effect. Patchy area of low-attenuation of the periventricular white matter presumed chronic microvascular ischemic changes. Vascular: No hyperdense vessel or unexpected calcification. Skull: Normal. Negative for fracture or focal lesion. Sinuses/Orbits: Mucosal thickening of the ethmoid air cells and right maxillary sinus. Other: None. IMPRESSION: 1. No acute intracranial abnormality. 2. Chronic microvascular ischemic changes of the periventricular white matter. 3. Mild paranasal sinus disease. Electronically Signed   By: Larose Hires D.O.   On: 04/21/2022 23:24    Procedures Procedures    Medications Ordered in ED Medications - No data to display  ED Course/ Medical Decision Making/ A&P     ABCD2 Score: 3                     Medical Decision Making Amount  and/or Complexity of Data Reviewed Radiology: ordered.  Risk Prescription drug management.   Patient is a 82 year old female who has a prior history of her reported TIA who presents with tingling in the left side of the face and left arm.  She currently does not have any symptoms.  There is no other neurologic symptoms.  No headache.  No dizziness.  Imaging of her head including a CT and MRI show no evidence of acute strokes.  Her labs are nonconcerning.  I am  questioning whether it may be coming from her neck although currently she does not have any neck pain or paresthesias.  She has had prior stroke workup in the recent past in August including a CTA of the head and neck, echocardiogram and MRI which were nonrevealing.  I discussed the findings with Dr. Iver Nestle with neurology who at this point did not feel that any further evaluation was indicated acutely.  Patient did report a cough.  Chest x-ray shows no evidence of pneumonia.  This was interpreted by me and confirmed by the radiologist.  Her COVID/flu test were negative.  She was given a prescription for [employer].  She is otherwise well-appearing and at this point does not have an indication for hospitalization.  She was encouraged to follow-up with both her primary care doctor and neurologist.  Return precautions were given.  Final Clinical Impression(s) / ED Diagnoses Final diagnoses:  Tingling of left upper extremity and left side of face  Subacute cough    Rx / DC Orders ED Discharge Orders          Ordered    benzonatate (TESSALON) 100 MG capsule  Every 8 hours        04/22/22 1411              Rolan Bucco, MD 04/22/22 1413    Rolan Bucco, MD 04/22/22 1414

## 2022-04-22 NOTE — Discharge Instructions (Signed)
Make an appointment to follow-up with both your primary care doctor and your neurologist.  Return to the emergency room if you have any worsening symptoms.

## 2022-05-07 ENCOUNTER — Telehealth: Payer: Self-pay | Admitting: Family Medicine

## 2022-05-07 NOTE — Telephone Encounter (Signed)
Called and spoke with pt. Relayed Amy Lomax,NP message. She will call PCP to schedule f/u post ER visit with them. If they feel she needs to f/u with [place] after, she will call back to schedule appt.

## 2022-05-07 NOTE — Telephone Encounter (Signed)
Pt is calling. Stated on 04/21/22 she was having stroke like symptoms. Said she went to the ER and was checked out and was told her was not having a stroke but she needed to let Amy know what's going on.

## 2022-05-07 NOTE — Telephone Encounter (Signed)
Amy, did you want her to schedule f/u with you? ED notes 04/21/22 instructed her to f/u with neurology

## 2023-01-05 ENCOUNTER — Ambulatory Visit (HOSPITAL_COMMUNITY)
Admission: RE | Admit: 2023-01-05 | Discharge: 2023-01-05 | Disposition: A | Discharge status: Home / Self Care | Payer: Medicare Other | Source: Ambulatory Visit | Attending: Gastroenterology | Admitting: Gastroenterology

## 2023-01-05 ENCOUNTER — Encounter (INDEPENDENT_AMBULATORY_CARE_PROVIDER_SITE_OTHER): Payer: Self-pay | Admitting: Gastroenterology

## 2023-01-05 ENCOUNTER — Ambulatory Visit (INDEPENDENT_AMBULATORY_CARE_PROVIDER_SITE_OTHER): Payer: Medicare Other | Admitting: Gastroenterology

## 2023-01-05 VITALS — BP 170/80 | HR 61 | Temp 98.2°F | Ht 65.5 in | Wt 143.5 lb

## 2023-01-05 DIAGNOSIS — K580 Irritable bowel syndrome with diarrhea: Secondary | ICD-10-CM

## 2023-01-05 DIAGNOSIS — I10 Essential (primary) hypertension: Secondary | ICD-10-CM

## 2023-01-05 DIAGNOSIS — K921 Melena: Secondary | ICD-10-CM

## 2023-01-05 MED ORDER — PSYLLIUM 58.6 % PO PACK: 1.0000 | PACK | Freq: Two times a day (BID) | ORAL | 2 refills | Status: AC

## 2023-01-05 NOTE — Progress Notes (Signed)
Vista Lawman , M.D. Gastroenterology & Hepatology [facility] Gastroenterology [address] [place], [place] [address] Primary Care Physician: Jonathon Bellows, DO [address] [place] [address]  Chief Complaint: Altered bowel movements with questionable melena  History of Present Illness: Zurii Regester is a 82 y.o. female with hypertension, IBS-D, GERD, alpha gal , history of microscopic colitis who presents for evaluation of bowel movements with black-colored stools.  Patient reports that for years she has altered bowel movements which she explains is diarrhea.  Her frequency of stool has not increased or decreased as she is having 3-4 bowel movements daily for many years.  Although she has noticed her bowel movements were dark and possibly black in color.  Patient takes loperamide 2 mg daily or twice daily when she has to travel.  Patient was given diagnosis of IBS-D but her complaints are not really abdominal pain alternating bowel  Denies any NSAID use, denies abdominal pain or dysphagia.  Patient reports history of alpha gal and does not eat pork or red meat  The patient denies having any nausea, vomiting, fever, chills, hematochezia, melena, hematemesis, abdominal distention, abdominal pain,  jaundice, pruritus or weight loss.  Last Colonoscopy:Dr. Lynnea Ferrier, 2018  microscopic colitis s/p enterocort x6 weeks.  Last Endoscopy:06/27/16 schatzki ring, mild gastritis, benign gastric polyp, small hh  Lab work from January 2024 normal liver enzyme hemoglobin 12.3 platelet 182 FHx: neg for any gastrointestinal/liver disease, no malignancies Social: neg smoking, alcohol or illicit drug use  Past Medical History: Past Medical History:  Diagnosis Date   Allergic reaction to alpha-gal    cannot eat beef or pork   GERD (gastroesophageal reflux disease)    Hypertension    IBS (irritable bowel syndrome)    Microscopic colitis     Past Surgical  History: Past Surgical History:  Procedure Laterality Date   APPENDECTOMY     HAND SURGERY Left 06/2020   Thumb/hand   OVARIAN CYST REMOVAL      Family History: Family History  Problem Relation Age of Onset   Heart disease Mother    Emphysema Father    Crohn's disease Sister     Social History: Social History   Tobacco Use  Smoking Status Never  Smokeless Tobacco Never   Social History   Substance and Sexual Activity  Alcohol Use Never   Social History   Substance and Sexual Activity  Drug Use Never    Allergies: Allergies  Allergen Reactions   Beef-Derived Products     Pt has Alpha-gal and cannot eat beef or pork   Advil [Ibuprofen] Cough   Pork-Derived Products     Pt has Alpha-gal and cannot eat beef or pork    Medications: Current Outpatient Medications  Medication Sig Dispense Refill   acetaminophen (TYLENOL) 325 MG tablet Take 650 mg by mouth every 6 (six) hours as needed.     albuterol (PROAIR HFA) 108 (90 Base) MCG/ACT inhaler Inhale 1 puff into the lungs every 6 (six) hours as needed for shortness of breath.     alendronate (FOSAMAX) 70 MG tablet Take 70 mg by mouth once a week. Take with a full glass of water on an empty stomach.     aspirin EC 81 MG tablet Take 81 mg by mouth daily. Swallow whole.     atorvastatin (LIPITOR) 40 MG tablet Take 1 tablet (40 mg total) by mouth at bedtime. 30 tablet 2   Beclomethasone Diprop HFA (QVAR REDIHALER  IN) Inhale into the lungs. One puff twice a day     benzonatate (TESSALON) 100 MG capsule Take 1 capsule (100 mg total) by mouth every 8 (eight) hours. 21 capsule 0   Biotin 1000 MCG tablet Take by mouth.     EPINEPHrine 0.3 mg/0.3 mL IJ SOAJ injection Inject 0.3 mg into the muscle as needed for anaphylaxis.     fluticasone (FLONASE) 50 MCG/ACT nasal spray Place 1 spray into both nostrils daily.     lisinopril (ZESTRIL) 20 MG tablet Take 20 mg by mouth daily.     loratadine (CLARITIN) 10 MG tablet Take 10 mg by  mouth daily.     Multiple Vitamins-Minerals (CENTRUM ULTRA WOMENS PO) Take by mouth daily.     omeprazole (PRILOSEC) 10 MG capsule Take 10 mg by mouth every other day.     psyllium (METAMUCIL) 58.6 % packet Take 1 packet by mouth 2 (two) times daily. 60 packet 2   No current facility-administered medications for this visit.    Review of Systems: GENERAL: negative for malaise, night sweats HEENT: No changes in hearing or vision, no nose bleeds or other nasal problems. NECK: Negative for lumps, goiter, pain and significant neck swelling RESPIRATORY: Negative for cough, wheezing CARDIOVASCULAR: Negative for chest pain, leg swelling, palpitations, orthopnea GI: SEE HPI MUSCULOSKELETAL: Negative for joint pain or swelling, back pain, and muscle pain. SKIN: Negative for lesions, rash HEMATOLOGY Negative for prolonged bleeding, bruising easily, and swollen nodes. ENDOCRINE: Negative for cold or heat intolerance, polyuria, polydipsia and goiter. NEURO: negative for tremor, gait imbalance, syncope and seizures. The remainder of the review of systems is noncontributory.   Physical Exam: BP (!) 174/83   Pulse 61   Temp 98.2 F (36.8 C) (Oral)   Ht 5' 5.5" (1.664 m)   Wt 143 lb 8 oz (65.1 kg)   BMI 23.52 kg/m  GENERAL: The patient is AO x3, in no acute distress. HEENT: Head is normocephalic and atraumatic. EOMI are intact. Mouth is well hydrated and without lesions. NECK: Supple. No masses LUNGS: Clear to auscultation. No presence of rhonchi/wheezing/rales. Adequate chest expansion HEART: RRR, normal s1 and s2. ABDOMEN: Soft, nontender, no guarding, no peritoneal signs, and nondistended. BS +. No masses. EXTREMITIES: Without any cyanosis, clubbing, rash, lesions or edema. NEUROLOGIC: AOx3, no focal motor deficit. SKIN: no jaundice, no rashes   Imaging/Labs: as above     Latest Ref Rng & Units 04/21/2022   11:03 PM 04/21/2022   10:52 PM 11/30/2021    9:48 AM  CBC  WBC 4.0 - 10.5  K/uL  5.7  5.1   Hemoglobin 12.0 - 15.0 g/dL 16.1  09.6  04.5   Hematocrit 36.0 - 46.0 % 38.0  38.1  38.9   Platelets 150 - 400 K/uL  182  161    No results found for: "IRON", "TIBC", "FERRITIN"  I personally reviewed and interpreted the available labs, imaging and endoscopic files.  Impression and Plan: Eriane Appiah is a 82 y.o. female with hypertension, IBS-D, GERD, alpha gal , history of microscopic colitis who presents for evaluation of bowel movements with black-colored stools.  #Altered Bowel movements   Patient usually has diarrhea with 3-4 bowel movements daily. She was given diagnosis of IBS-D but upon questioning today does not qualify for Rome IV criteria for IBS as she does not have abdominal pain alternating with bowel movements  This could be overflow diarrhea and we will obtain abdominal ultrasound to assess stool burden  For now we will start MiraLAX twice a day to bulk up the stool.  If large or moderate stool burden is encountered we will give bowel purge  I do not suspect patient has active microscopic colitis flare as her bowel movement frequency has remained same for many years  # Questionable melena  Patient reports of witnessing few episodes of liquid black tarry stools concerning for melena.  With advanced age and last upper endoscopy 2018 reasonable to proceed with upper endoscopy to ensure or rule out any upper GI lesion such as malignancy  #Hypertension  The patient was found to have elevated blood pressure when vital signs were checked in the office. The blood pressure was rechecked by the nursing staff and it was found be persistently elevated >140/90 mmHg. I personally advised to the patient to follow up closely with PCP for hypertension control.    All questions were answered.      Vista Lawman, MD Gastroenterology and Hepatology [facility] Gastroenterology   This chart has been completed using [facility] Dictation  software, and while attempts have been made to ensure accuracy , certain words and phrases may not be transcribed as intended

## 2023-01-05 NOTE — Patient Instructions (Signed)
It was very nice to meet you today, as dicussed with will plan for the following :  1) Abdominal xray  Ensure adequate fluid intake: Aim for 8 glasses of water daily. Follow a high fiber diet: Include foods such as dates, prunes, pears, and kiwi. Use Metamucil twice a day.   2) Upper endoscopy

## 2024-02-03 ENCOUNTER — Encounter (INDEPENDENT_AMBULATORY_CARE_PROVIDER_SITE_OTHER): Payer: Self-pay | Admitting: Gastroenterology
